# Patient Record
Sex: Female | Born: 1985
Health system: Southern US, Community
[De-identification: ages and names within clinical notes are randomized; demographics above are authoritative.]

## PROBLEM LIST (undated history)

## (undated) DIAGNOSIS — R87619 Unspecified abnormal cytological findings in specimens from cervix uteri: Secondary | ICD-10-CM

## (undated) DIAGNOSIS — F419 Anxiety disorder, unspecified: Secondary | ICD-10-CM

## (undated) DIAGNOSIS — E079 Disorder of thyroid, unspecified: Secondary | ICD-10-CM

## (undated) HISTORY — DX: Unspecified abnormal cytological findings in specimens from cervix uteri: R87.619

## (undated) HISTORY — PX: COLPOSCOPY: SHX161

## (undated) HISTORY — DX: Anxiety disorder, unspecified: F41.9

## (undated) HISTORY — DX: Disorder of thyroid, unspecified: E07.9

## (undated) HISTORY — PX: CERVICAL BIOPSY  W/ LOOP ELECTRODE EXCISION: SUR135

---

## 2015-12-09 ENCOUNTER — Encounter: Payer: Self-pay | Admitting: Obstetrics and Gynecology

## 2015-12-09 ENCOUNTER — Ambulatory Visit (INDEPENDENT_AMBULATORY_CARE_PROVIDER_SITE_OTHER): Payer: BLUE CROSS/BLUE SHIELD | Admitting: Obstetrics and Gynecology

## 2015-12-09 VITALS — BP 148/90 | HR 84 | Resp 14 | Ht 67.5 in | Wt 163.0 lb

## 2015-12-09 DIAGNOSIS — N760 Acute vaginitis: Secondary | ICD-10-CM

## 2015-12-09 DIAGNOSIS — B9689 Other specified bacterial agents as the cause of diseases classified elsewhere: Secondary | ICD-10-CM

## 2015-12-09 DIAGNOSIS — R030 Elevated blood-pressure reading, without diagnosis of hypertension: Secondary | ICD-10-CM | POA: Diagnosis not present

## 2015-12-09 DIAGNOSIS — A499 Bacterial infection, unspecified: Secondary | ICD-10-CM

## 2015-12-09 DIAGNOSIS — D069 Carcinoma in situ of cervix, unspecified: Secondary | ICD-10-CM | POA: Diagnosis not present

## 2015-12-09 MED ORDER — METRONIDAZOLE 500 MG PO TABS: 500.0000 mg | ORAL_TABLET | Freq: Two times a day (BID) | ORAL | Status: DC

## 2015-12-09 NOTE — Patient Instructions (Signed)
Loop Electrosurgical Excision Procedure  Loop electrosurgical excision procedure (LEEP) is the removal of a portion of the lower part of the uterus (cervix). The procedure is done when there are significantly abnormal cervical cell changes. Abnormal cell changes of the cervix can lead to cancer if left in place and untreated.     The LEEP procedure itself typically only takes a few minutes. Often, it may be done in your caregiver's office. The procedure is considered safe for those who wish to get pregnant or are trying to get pregnant. Only under rare circumstances should this procedure be done if you are pregnant.  LET YOUR CAREGIVER KNOW ABOUT:  · Whether you are pregnant or late for your last menstrual period.  · Allergies to foods or medicines.  · All the medicines you are taking including herbs, eyedrops, and over-the-counter medicines, and creams.  · Use of steroids (by mouth or creams).  · Previous problems with anesthetics or numbing medicine.  · Previous gynecological surgery.  · History of blood clots or bleeding problems.  · Any recent or current vaginal infections (herpes, sexually transmitted infections).  · Other health problems.  RISKS AND COMPLICATIONS  · Bleeding.  · Infection.  · Injury to the vagina, bladder, or rectum.  · Very rare obstruction of the cervical opening that causes problems during menstruation (cervical stenosis).  BEFORE THE PROCEDURE  · Do not take aspirin or blood thinners (anticoagulants) for 1 week before the procedure, or as told by your caregiver.  · Eat a light meal before the procedure.  · Ask your caregiver about changing or stopping your regular medicines.  · You may be given a pain reliever 1 or 2 hours before the procedure.  PROCEDURE   · A tool (speculum) is placed in the vagina. This allows your caregiver to see the cervix.  · An iodine stain is applied to the cervix to find the area of abnormal cells to be removed.  · Medicine is injected to numb the cervix (local  anesthetic).    · Electricity is passed through a thin wire loop which is then used to remove (cauterize) a small segment of the affected cervix.  · Light electrocautery is used to seal any small blood vessels and prevent bleeding.  · A paste may be applied to the cauterized area of the cervix to help prevent bleeding.  · The tissue sample is sent to the lab. It is examined under the microscope.  AFTER THE PROCEDURE  · Have someone drive you home.  · You may have slight to moderate cramping.  · You may notice a black vaginal discharge from the paste used on the cervix to prevent bleeding. This is normal.  · Watch for excessive bleeding. This requires immediate medical care.  · Ask when your test results will be ready. Make sure you get your test results.     This information is not intended to replace advice given to you by your health care provider. Make sure you discuss any questions you have with your health care provider.     Document Released: 12/12/2002 Document Revised: 12/14/2011 Document Reviewed: 03/03/2011  Elsevier Interactive Patient Education ©2016 [employer] Inc[employer]

## 2015-12-09 NOTE — Progress Notes (Signed)
Patient ID: Ma HillockMarissa R Bush, female   DOB: May 31, 1986, 30 y.o.   MRN: [id number] 30 y.o. G1P0010 SingleCaucasianF here to follow up abnormal PAP/colposcopy that was done in [place].  Records reviewed. Her pap from 10/15/15 returned with ASC-H, cannot r/o HGSIL, +BV. Colposcopy was done on 11/13/15, it was satisfactory, 2 cervical biopsies returned with CIN I, ECC with CIN 2-3. She denies increased vaginal d/c or odor.  Prior to this visit, her BP has been normal at her GYN visits for the last few years.  She hasn't been sexually active since January. Plans to abstain until she is finished with treated for the cervical dysplasia.  Period Cycle (Days): 28 Period Duration (Days): 4 days  Period Pattern: Regular Menstrual Flow: Moderate Menstrual Control: Maxi pad, Tampon Dysmenorrhea: (!) Moderate Dysmenorrhea Symptoms: Cramping  Patient's last menstrual period was 11/17/2015.          Sexually active: Yes.    The current method of family planning is none.    Exercising: Yes.    5 days a week Smoker:  no  Health Maintenance: Pap:  10/15/15 HSIL - had colposcopy CIN2-3 HSIL History of abnormal Pap:  yes MMG:  Had MRI of breast 11/2014- WNL  Colonoscopy:  Never BMD:   Never TDaP:  unsure Gardasil: no    reports that she has never smoked. She has never used smokeless tobacco. She reports that she drinks alcohol. She reports that she does not use illicit drugs. The patient recently moved here from [place]. She is a Charity fundraiserChemist.   She lost 120 lbs in the last 2 years.   Past Medical History  Diagnosis Date  . Abnormal Pap smear of cervix     Past Surgical History  Procedure Laterality Date  . Colposcopy      Current Outpatient Prescriptions  Medication Sig Dispense Refill  . metroNIDAZOLE (FLAGYL) 500 MG tablet Take 1 tablet (500 mg total) by mouth 2 (two) times daily. 14 tablet 0  . Prenatal Multivit-Min-Fe-FA (PRE-NATAL PO) Take by mouth.    . valACYclovir (VALTREX) 1000 MG tablet   7    No current facility-administered medications for this visit.    Family History  Problem Relation Age of Onset  . Breast cancer Mother 2237    age 30 and 4554  . Lung cancer Maternal Grandmother     Review of Systems  Constitutional: Negative.   HENT: Negative.   Eyes: Negative.   Respiratory: Negative.   Cardiovascular: Negative.   Gastrointestinal: Negative.   Endocrine: Negative.   Genitourinary: Negative.   Musculoskeletal: Negative.   Skin: Negative.   Allergic/Immunologic: Negative.   Neurological: Negative.   Psychiatric/Behavioral: Negative.     Exam:   BP 148/90 mmHg  Pulse 84  Resp 14  Ht 5' 7.5" (1.715 m)  Wt 163 lb (73.936 kg)  BMI 25.14 kg/m2  LMP 11/17/2015  Weight change: @WEIGHTCHANGE @ Height:   Height: 5' 7.5" (171.5 cm)  Ht Readings from Last 3 Encounters:  12/09/15 5' 7.5" (1.715 m)    General appearance: alert, cooperative and appears stated age  A:  ASC-H pap, colpo with CIN 1 and ECC + for CIN II-III  BV on pap  Elevated BP, no h/o elevated BP (records reviewed), she is very anxious  P:   Reviewed her abnormal pap and colposcopy results. Discussed hpv infection.   Recommend she have a loop cone cervical biopsy  Discussed the risks, including: bleeding, infection, cervical incompetence or pre-term dilation/labor. Patient aware she  can't be sexually active for 1 month s/p loop cone  Questions answered  Will treat BV to help decrease the risk of infection.  CC: Dr Leland Johns    20 minutes was spent face to face with the patient, >50% in counseling

## 2015-12-11 ENCOUNTER — Other Ambulatory Visit: Payer: Self-pay

## 2015-12-16 ENCOUNTER — Telehealth: Payer: Self-pay

## 2015-12-16 ENCOUNTER — Ambulatory Visit: Payer: BLUE CROSS/BLUE SHIELD | Admitting: Obstetrics and Gynecology

## 2015-12-16 NOTE — Telephone Encounter (Signed)
Spoke with patient at time of incoming call. Patient is scheduled for a LEEP today at 12:45 pm with Dr.Jertson. Reports she started her cycle yesterday 12/15/2015 and is having moderate bleeding. States that her cycle usually lasts 4 days. Appointment for LEEP rescheduled to 12/19/2015 at 9 am with Dr.Jertson. She is agreeable to date and time.  Routing to provider for final review. Patient agreeable to disposition. Will close encounter.

## 2015-12-19 ENCOUNTER — Ambulatory Visit (INDEPENDENT_AMBULATORY_CARE_PROVIDER_SITE_OTHER): Payer: BLUE CROSS/BLUE SHIELD | Admitting: Obstetrics and Gynecology

## 2015-12-19 ENCOUNTER — Encounter: Payer: Self-pay | Admitting: Obstetrics and Gynecology

## 2015-12-19 DIAGNOSIS — D069 Carcinoma in situ of cervix, unspecified: Secondary | ICD-10-CM

## 2015-12-19 NOTE — Patient Instructions (Signed)
LEEP Post-procedure Instructions . Cramping is common.  You may take Ibuprofen, Aleve, or Tylenol for the cramping.  This should resolve within the next two to three days.   . You may have bright red spotting or blackish discharge for several days after your procedure.  The discharge occurs because of a topical solution used to stop bleeding at the biopsy site(s).  The dark discharge will lighten and then turn clear before completely resolving.  You will need to wear a mini pad during this time. . . Refrain from putting anything in the vagina until the bleeding and/or discharge COMPLETLEY stops (usually two to three weeks). . You need to call the office if you have any pelvic pain, fever, heavy bleeding, foul smelling vaginal discharge, or if you are concerned. . Shower or bathe as normal . You will be notified within one week of your biopsy results or we will discuss your results at your follow-up appointment if needed. . You will need to return for surveillance Pap smear(s) as advised by your physician. Marland Kitchen. Avoid intercourse x 4 weeks

## 2015-12-19 NOTE — Progress Notes (Signed)
Patient ID: Jasmine Bush, female   DOB: [birth year], 30 y.o.   MRN: [id number] GYNECOLOGY  VISIT   HPI: 30 y.o.   Single  Caucasian  female   G1P0010 with Patient's last menstrual period was 12/15/2015.   here for a LOOP cone for HSIL on ECC  GYNECOLOGIC HISTORY: Patient's last menstrual period was 12/15/2015. Contraception:none Menopausal hormone therapy: none         OB History    Gravida Para Term Preterm AB TAB SAB Ectopic Multiple Living   1    1              There are no active problems to display for this patient.   Past Medical History  Diagnosis Date  . Abnormal Pap smear of cervix     Past Surgical History  Procedure Laterality Date  . Colposcopy      Current Outpatient Prescriptions  Medication Sig Dispense Refill  . Prenatal Multivit-Min-Fe-FA (PRE-NATAL PO) Take by mouth.    . valACYclovir (VALTREX) 1000 MG tablet   7   No current facility-administered medications for this visit.     ALLERGIES: Cephalosporins and Penicillins  Family History  Problem Relation Age of Onset  . Breast cancer Mother 3337    age 30 and 7254  . Lung cancer Maternal Grandmother     Social History   Social History  . Marital Status: Single    Spouse Name: N/A  . Number of Children: N/A  . Years of Education: N/A   Occupational History  . Not on file.   Social History Main Topics  . Smoking status: Never Smoker   . Smokeless tobacco: Never Used  . Alcohol Use: 0.0 oz/week    0 Standard drinks or equivalent per week     Comment: 1-2 per month  . Drug Use: No  . Sexual Activity:    Partners: Male   Other Topics Concern  . Not on file   Social History Narrative    Review of Systems  Constitutional: Negative.   HENT: Negative.   Eyes: Negative.   Respiratory: Negative.   Cardiovascular: Negative.   Gastrointestinal: Negative.   Genitourinary: Negative.   Musculoskeletal: Negative.   Skin: Negative.   Neurological: Negative.   Endo/Heme/Allergies:  Negative.   Psychiatric/Behavioral: Negative.     PHYSICAL EXAMINATION:    BP 142/80 mmHg  Pulse 108  Resp 16  Wt 165 lb (74.844 kg)  LMP 12/15/2015    General appearance: alert, cooperative and appears stated age  Negative UPT  Procedure: The patient was counseled as to the risks of the procedure, including: infection, bleeding, future pregnancy risks and cervical stenosis. A consent form was signed.  Colposcopy was unsatisfactory. Lugols solution was placed on the cervix and a paracervical block was injected using 1% lidocaine with epinephrine. The 1.2 x 1 cm loop was used to remove a portion of the exocervix taking care to get the entire transformation zone.  A second 1 x 1 cm loop was used to remove a portion of the endocervix. The settings were 55 cut, 50 coag with a blend of 1.  An ECC was performed. The cautery ball was then used to cauterize the base of the biopsy site and monsels were placed. The patient tolerated the procedure well.    Chaperone was present for exam.  ASSESSMENT HSIL on ECC    PLAN LOOP cone cervical biopsy with ECC done F/U in    An After Visit Summary was  printed and given to the patient.

## 2015-12-27 ENCOUNTER — Telehealth: Payer: Self-pay | Admitting: Emergency Medicine

## 2015-12-27 NOTE — Telephone Encounter (Signed)
Patient notified of results and verbalized understanding of results. She is agreeable to follow up PAP with HPV in 6 months and this is scheduled for 06/25/16. 06 Recall entered.

## 2015-12-27 NOTE — Telephone Encounter (Signed)
-----   Message from Romualdo BolkJill Evelyn Jertson, MD sent at 12/24/2015  5:55 PM EDT ----- Please inform the patient that her loop cone returned with CIN 3, with negative deep endocervical margins, her ECC was negative but scant sample. Typically I would repeat her pap and hpv in 1 year, but since both the margins of the endocervical leep were negative for dysplasia, but still + for CIN III (usually continuous lesion and + in the exocervical leep specimen) and her scant ECC, I would like to repeat her pap with hpv in 6 months. Please inform and set up

## 2016-01-22 ENCOUNTER — Ambulatory Visit (INDEPENDENT_AMBULATORY_CARE_PROVIDER_SITE_OTHER): Payer: BLUE CROSS/BLUE SHIELD | Admitting: Obstetrics and Gynecology

## 2016-01-22 ENCOUNTER — Encounter: Payer: Self-pay | Admitting: Obstetrics and Gynecology

## 2016-01-22 VITALS — BP 130/80 | HR 80 | Resp 15 | Wt 170.0 lb

## 2016-01-22 DIAGNOSIS — Z9889 Other specified postprocedural states: Secondary | ICD-10-CM

## 2016-01-22 NOTE — Progress Notes (Signed)
Patient ID: Jasmine Bush, female   DOB: 11-07-1985, 30 y.o.   MRN: [id number] GYNECOLOGY  VISIT   HPI: 30 y.o.   Single  Caucasian  female   G1P0010 with Patient's last menstrual period was 12/15/2015.   here for 1 month follow up from LEEP procedure. She had a loop cone for CIN III, the endocervical portion was +CIN III,  Both margins were negative and her ECC was scant. Exocervix with CIN III with +endocervical and negative exocervical margins. She spotted for a couple of weeks after. She is a little late for her cycle. Normally very regular. No pain. She is not sexually active, hasn't been for a long time.   GYNECOLOGIC HISTORY: Patient's last menstrual period was 12/15/2015. Contraception:None ( Not currently sexually active)  Menopausal hormone therapy: None        OB History    Gravida Para Term Preterm AB TAB SAB Ectopic Multiple Living   1    1              There are no active problems to display for this patient.   Past Medical History  Diagnosis Date  . Abnormal Pap smear of cervix     Past Surgical History  Procedure Laterality Date  . Colposcopy      Current Outpatient Prescriptions  Medication Sig Dispense Refill  . Prenatal Multivit-Min-Fe-FA (PRE-NATAL PO) Take by mouth.    . valACYclovir (VALTREX) 1000 MG tablet   7   No current facility-administered medications for this visit.     ALLERGIES: Cephalosporins and Penicillins  Family History  Problem Relation Age of Onset  . Breast cancer Mother 4237    age 30 and 4654  . Lung cancer Maternal Grandmother     Social History   Social History  . Marital Status: Single    Spouse Name: N/A  . Number of Children: N/A  . Years of Education: N/A   Occupational History  . Not on file.   Social History Main Topics  . Smoking status: Never Smoker   . Smokeless tobacco: Never Used  . Alcohol Use: 0.0 oz/week    0 Standard drinks or equivalent per week     Comment: 1-2 per month  . Drug Use: No  .  Sexual Activity:    Partners: Male   Other Topics Concern  . Not on file   Social History Narrative    Review of Systems  Constitutional: Negative.   HENT: Negative.   Eyes: Negative.   Respiratory: Negative.   Cardiovascular: Negative.   Gastrointestinal: Negative.   Genitourinary: Negative.   Musculoskeletal: Negative.   Skin: Negative.   Neurological: Negative.   Endo/Heme/Allergies: Negative.   Psychiatric/Behavioral: Negative.     PHYSICAL EXAMINATION:    BP 130/80 mmHg  Pulse 80  Resp 15  Wt 170 lb (77.111 kg)  LMP 12/15/2015    General appearance: alert, cooperative and appears stated age  Pelvic: External genitalia:  no lesions              Urethra:  normal appearing urethra with no masses, tenderness or lesions              Bartholins and Skenes: normal                 Vagina: normal appearing vagina with normal color and discharge, no lesions              Cervix: no lesions and well healed, able  to pass a cytobrush into her cervix              Bimanual Exam:  Uterus:  normal size, contour, position, consistency, mobility, non-tender and anteverted              Adnexa: no mass, fullness, tenderness              Chaperone was present for exam.  ASSESSMENT S/P LEEP healing well Cycle is late, not sexually active.    PLAN F/U pap in 9/17 She will call in 2 weeks if she doesn't start a cycle   An After Visit Summary was printed and given to the patient.

## 2016-02-20 ENCOUNTER — Ambulatory Visit: Payer: BLUE CROSS/BLUE SHIELD | Admitting: Obstetrics and Gynecology

## 2016-06-25 ENCOUNTER — Ambulatory Visit (INDEPENDENT_AMBULATORY_CARE_PROVIDER_SITE_OTHER): Payer: BLUE CROSS/BLUE SHIELD | Admitting: Obstetrics and Gynecology

## 2016-06-25 ENCOUNTER — Encounter: Payer: Self-pay | Admitting: Obstetrics and Gynecology

## 2016-06-25 VITALS — BP 120/80 | HR 92 | Resp 20 | Ht 67.5 in | Wt 153.4 lb

## 2016-06-25 DIAGNOSIS — Z3169 Encounter for other general counseling and advice on procreation: Secondary | ICD-10-CM | POA: Diagnosis not present

## 2016-06-25 DIAGNOSIS — Z124 Encounter for screening for malignant neoplasm of cervix: Secondary | ICD-10-CM

## 2016-06-25 DIAGNOSIS — Z87410 Personal history of cervical dysplasia: Secondary | ICD-10-CM | POA: Diagnosis not present

## 2016-06-25 DIAGNOSIS — Z803 Family history of malignant neoplasm of breast: Secondary | ICD-10-CM | POA: Diagnosis not present

## 2016-06-25 DIAGNOSIS — N946 Dysmenorrhea, unspecified: Secondary | ICD-10-CM

## 2016-06-25 MED ORDER — NAPROXEN SODIUM 550 MG PO TABS: 550.0000 mg | ORAL_TABLET | Freq: Two times a day (BID) | ORAL | 2 refills | Status: DC

## 2016-06-25 NOTE — Progress Notes (Signed)
GYNECOLOGY  VISIT   HPI: 30 y.o.   Single  Caucasian  female   G1P0010 with Patient's last menstrual period was 06/18/2016.   here for 6 month pap smear. Loop cone for CIN III. Endocervical portion was + CIN III. Both margins neg, ECC was scant. Exocervix with CIN III with +Endocervical and neg exocervical margins.  She has been unprotected intercourse for over a year without pregnancy.  Having sex every other day during the fertile time. Menses q 28-29 days x 4-5 days. Saturates a pad in 2 hours. No BTB. Cramps are bad, not really helped with tylenol.  No h/o STD's Fiance, Jarred Fitch, 25, healthy. No drugs or smoking.  Mom had breast cancer at 63, BRCA negative. Was told to start getting mammograms this year. She had a breast MRI last year that was normal.  Getting married in 11/17 on the [place].   GYNECOLOGIC HISTORY: Patient's last menstrual period was 06/18/2016. Contraception:None Menopausal hormone therapy: None        OB History    Gravida Para Term Preterm AB Living   1       1     SAB TAB Ectopic Multiple Live Births                     There are no active problems to display for this patient.   Past Medical History:  Diagnosis Date  . Abnormal Pap smear of cervix     Past Surgical History:  Procedure Laterality Date  . COLPOSCOPY      Current Outpatient Prescriptions  Medication Sig Dispense Refill  . Prenatal Multivit-Min-Fe-FA (PRE-NATAL PO) Take by mouth.    . sertraline (ZOLOFT) 100 MG tablet Take 200 mg by mouth daily.  2  . valACYclovir (VALTREX) 1000 MG tablet   7   No current facility-administered medications for this visit.      ALLERGIES: Cephalosporins and Penicillins  Family History  Problem Relation Age of Onset  . Breast cancer Mother 68    age 38 and 46  . Lung cancer Maternal Grandmother     Social History   Social History  . Marital status: Single    Spouse name: N/A  . Number of children: N/A  . Years of education: N/A    Occupational History  . Not on file.   Social History Main Topics  . Smoking status: Never Smoker  . Smokeless tobacco: Never Used  . Alcohol use 0.0 oz/week     Comment: 1-2 per month  . Drug use: No  . Sexual activity: Yes    Partners: Male   Other Topics Concern  . Not on file   Social History Narrative  . No narrative on file    Review of Systems  Constitutional: Negative.   HENT: Negative.   Eyes: Negative.   Respiratory: Negative.   Cardiovascular: Negative.   Gastrointestinal: Negative.   Genitourinary: Negative.   Musculoskeletal: Negative.   Skin: Negative.   Neurological: Negative.   Endo/Heme/Allergies: Negative.   Psychiatric/Behavioral: Negative.     PHYSICAL EXAMINATION:    BP 120/80 (BP Location: Right Arm, Patient Position: Sitting, Cuff Size: Normal)   Pulse 92   Resp 20   Ht 5' 7.5" (1.715 m)   Wt 153 lb 6.4 oz (69.6 kg)   LMP 06/18/2016   BMI 23.67 kg/m     General appearance: alert, cooperative and appears stated age  Pelvic: External genitalia:  no lesions  Urethra:  normal appearing urethra with no masses, tenderness or lesions              Bartholins and Skenes: normal                 Vagina: normal appearing vagina with normal color and discharge, no lesions              Cervix:no lesions  Chaperone was present for exam.  ASSESSMENT CIN III, s/p leep, scant ECC, endocervical portion with CIN III with negative margins on both sides Infertility, normal cycles Dysmenorrhea Family history of breast cancer in her mother at 66 and again in her 61's, BRCA negative, doing well      PLAN Pap with hpv Plan annual exam in 6 months Start BBT charts Semen analysis Can try Ovulation Predictor kits F/U in 2 months to review Will set her up for a screening mammogram Anaprox for cramps   An After Visit Summary was printed and given to the patient.

## 2016-06-29 ENCOUNTER — Telehealth: Payer: Self-pay | Admitting: Obstetrics and Gynecology

## 2016-06-29 NOTE — Telephone Encounter (Signed)
Jasmine BurgerCarrie Bush from [facility] calling to find out if this referral is for [name] for semen analysis or is it for both of them. She states she needs the address and phone to schedule an appointment. She can be reached at [phone].

## 2016-07-03 LAB — IPS PAP TEST WITH HPV

## 2016-07-06 ENCOUNTER — Telehealth: Payer: Self-pay | Admitting: *Deleted

## 2016-07-06 MED ORDER — METRONIDAZOLE 500 MG PO TABS: 500.0000 mg | ORAL_TABLET | Freq: Two times a day (BID) | ORAL | 0 refills | Status: DC

## 2016-07-06 NOTE — Telephone Encounter (Signed)
-----   Message from Romualdo BolkJill Evelyn Jertson, MD sent at 07/03/2016  6:28 PM EDT ----- Please call the patient and let her know her pap is negative and her hpv is negative!!! She had a loop cone 6 months ago. Pap was + for BV, if symptomatic, please treat with flagyl 500 mg po BID x 7 days (no ETOH) F/U pap in 6 months

## 2016-07-06 NOTE — Telephone Encounter (Signed)
Spoke with patient and gave results of PAP. Patient is having some discharge so RX for Flagyl was sent int. Patient was advised to avoid alcohol while taking the Flagyl. Patient voiced understanding. Patient placed in 06 recall and appointment made for 6 month repeat PAP scheduled -eh

## 2016-07-07 ENCOUNTER — Telehealth: Payer: Self-pay

## 2016-07-07 DIAGNOSIS — Z1231 Encounter for screening mammogram for malignant neoplasm of breast: Secondary | ICD-10-CM

## 2016-07-07 DIAGNOSIS — Z803 Family history of malignant neoplasm of breast: Secondary | ICD-10-CM

## 2016-07-07 NOTE — Telephone Encounter (Signed)
Jasmine BolkJill Evelyn Jertson, MD  Jasmine AskewKaitlyn E Hines, RN        Please schedule this patient for a screening mammogram at the breast center. She is only 30, this would be her first imaging there (had MRI elsewhere last year). She was told to start mammograms this year. Mother with breast cancer at 437.  Thanks,  Jasmine BillsJill    Spoke with the [facility]. Appointment for screening mammogram scheduled for 07/10/2016 at 4:30 pm. Spoke with patient. Patient is agreeable to date and time.  Routing to provider for final review. Patient agreeable to disposition. Will close encounter.

## 2016-07-10 ENCOUNTER — Ambulatory Visit
Admission: RE | Admit: 2016-07-10 | Discharge: 2016-07-10 | Disposition: A | Discharge status: Home / Self Care | Payer: BLUE CROSS/BLUE SHIELD | Source: Ambulatory Visit | Attending: Obstetrics and Gynecology | Admitting: Obstetrics and Gynecology

## 2016-07-10 DIAGNOSIS — Z803 Family history of malignant neoplasm of breast: Secondary | ICD-10-CM

## 2016-07-10 DIAGNOSIS — Z1231 Encounter for screening mammogram for malignant neoplasm of breast: Secondary | ICD-10-CM

## 2016-08-13 ENCOUNTER — Other Ambulatory Visit: Payer: Self-pay

## 2016-08-13 DIAGNOSIS — Z803 Family history of malignant neoplasm of breast: Secondary | ICD-10-CM

## 2016-08-20 ENCOUNTER — Telehealth: Payer: Self-pay | Admitting: Obstetrics and Gynecology

## 2016-08-20 NOTE — Telephone Encounter (Signed)
Please let the patient know that her partners semen analysis is normal other than a slightly low normal morphology. Dr April MansonYalcinkaya recommends he take Vit C 1,000 mg a day, vit E 400 units a day, folic acid 400 mcg a day and zinc 50 mg a day. Please set them up for a consultation with Dr April MansonYalcinkaya.

## 2016-08-21 NOTE — Telephone Encounter (Signed)
Spoke with patient. Advised of results and message as seen below from Dr.Jertson. Patient is agreeable and will notify her partner. She will contact Dr.Yalcinkaya's office to schedule a consultation appointment at her earliest convenience.  Routing to provider for final review. Patient agreeable to disposition. Will close encounter.

## 2016-08-24 ENCOUNTER — Encounter: Payer: Self-pay | Admitting: Obstetrics and Gynecology

## 2016-08-24 ENCOUNTER — Telehealth: Payer: Self-pay | Admitting: Genetic Counselor

## 2016-08-24 ENCOUNTER — Ambulatory Visit (INDEPENDENT_AMBULATORY_CARE_PROVIDER_SITE_OTHER): Payer: BLUE CROSS/BLUE SHIELD | Admitting: Obstetrics and Gynecology

## 2016-08-24 ENCOUNTER — Encounter: Payer: Self-pay | Admitting: Genetic Counselor

## 2016-08-24 VITALS — BP 118/70 | HR 84 | Resp 14 | Wt 161.0 lb

## 2016-08-24 DIAGNOSIS — Z3169 Encounter for other general counseling and advice on procreation: Secondary | ICD-10-CM | POA: Diagnosis not present

## 2016-08-24 DIAGNOSIS — Z803 Family history of malignant neoplasm of breast: Secondary | ICD-10-CM | POA: Diagnosis not present

## 2016-08-24 DIAGNOSIS — N946 Dysmenorrhea, unspecified: Secondary | ICD-10-CM | POA: Diagnosis not present

## 2016-08-24 NOTE — Telephone Encounter (Signed)
Pt confirmed appt, verified demo and insurance, mailed pt letter, faxed referring provider appt date/time. °

## 2016-08-24 NOTE — Progress Notes (Signed)
GYNECOLOGY  VISIT   HPI: 30 y.o.   Single caucasian  female   G1P0010 with Patient's last menstrual period was 08/16/2016.   here for follow up basal body temp charting. Pt also c/o dysmenorrhea.      IUD was taken out in May in 2016. Cycles are every 30 days x 4-6 days. Saturates a pad in up to 2 hours. No bleeding in between her cycles. Since having her IUD out her cramps have slowly gotten worse. She has Naproxen that helps a little. Heating pad helps. Goes to work, but doesn't function well.  + ovulation on BBT charts. She hasn't tried ovulation predictor kits yet, but plans to this month. She c/o intermittent cramping after intercourse, occurring about 2/10 times, mild. No dyspareunia.  Partners SA with slightly low normal morphology.  She got married 08/15/16, had a great time.   GYNECOLOGIC HISTORY: Patient's last menstrual period was 08/16/2016. Contraception:none  Menopausal hormone therapy: none         OB History    Gravida Para Term Preterm AB Living   1       1     SAB TAB Ectopic Multiple Live Births                     Patient Active Problem List   Diagnosis Date Noted  . History of cervical dysplasia 06/25/2016  . Family history of breast cancer 06/25/2016    Past Medical History:  Diagnosis Date  . Abnormal Pap smear of cervix     Past Surgical History:  Procedure Laterality Date  . COLPOSCOPY      Current Outpatient Prescriptions  Medication Sig Dispense Refill  . naproxen sodium (ANAPROX DS) 550 MG tablet Take 1 tablet (550 mg total) by mouth 2 (two) times daily with a meal. 30 tablet 2  . Prenatal Multivit-Min-Fe-FA (PRE-NATAL PO) Take by mouth.    . sertraline (ZOLOFT) 100 MG tablet Take 200 mg by mouth daily.  2  . valACYclovir (VALTREX) 1000 MG tablet   7   No current facility-administered medications for this visit.      ALLERGIES: Cephalosporins and Penicillins  Family History  Problem Relation Age of Onset  . Breast cancer Mother [id number]     age 30 and 2654  . Lung cancer Maternal Grandmother   . Endometriosis Sister     Social History   Social History  . Marital status: Single    Spouse name: N/A  . Number of children: N/A  . Years of education: N/A   Occupational History  . Not on file.   Social History Main Topics  . Smoking status: Never Smoker  . Smokeless tobacco: Never Used  . Alcohol use 0.0 oz/week     Comment: 1-2 per month  . Drug use: No  . Sexual activity: Yes    Partners: Male   Other Topics Concern  . Not on file   Social History Narrative  . No narrative on file    Review of Systems  Constitutional: Negative.   HENT: Negative.   Eyes: Negative.   Respiratory: Negative.   Cardiovascular: Negative.   Gastrointestinal: Negative.   Genitourinary:       Dysmenorrhea   Musculoskeletal: Negative.   Skin: Negative.   Neurological: Negative.   Endo/Heme/Allergies: Negative.   Psychiatric/Behavioral: Negative.     PHYSICAL EXAMINATION:    BP 118/70 (BP Location: Right Arm, Patient Position: Sitting, Cuff Size: Normal)   Pulse 84  Resp 14   Wt 161 lb (73 kg)   LMP 08/16/2016   BMI 24.84 kg/m     General appearance: alert, cooperative and appears stated age  ASSESSMENT Infertility, SA with low morphology Dysmenorrhea FH of breast cancer    PLAN Recommended she f/u with Dr April MansonYalcinkaya Continue PNV Continue BBT and do OPkits this month Consult with a genetic counselor    An After Visit Summary was printed and given to the patient.  10 minutes face to face time of which over 50% was spent in counseling.   CC: Dr April MansonYalcinkaya

## 2016-10-03 ENCOUNTER — Other Ambulatory Visit: Payer: Self-pay | Admitting: Obstetrics and Gynecology

## 2016-10-06 NOTE — Telephone Encounter (Signed)
Medication refill request: Valacyclovir Last AEX:  08/24/16 JJ (Last visit) Next AEX: 01/06/17 JJ Last MMG (if hormonal medication request): 07/15/16 Blake [email or web address], [facility] Refill authorized: Historical Entry. 12/08/15

## 2016-10-08 ENCOUNTER — Telehealth: Payer: Self-pay | Admitting: Genetic Counselor

## 2016-10-08 NOTE — Telephone Encounter (Signed)
Appointments canceled per patient request. Patient has moved away. She stated that she does not wish to reschedule.

## 2016-10-12 ENCOUNTER — Encounter: Payer: BLUE CROSS/BLUE SHIELD | Admitting: Genetic Counselor

## 2016-10-12 ENCOUNTER — Other Ambulatory Visit: Payer: BLUE CROSS/BLUE SHIELD

## 2017-01-03 HISTORY — PX: INTRAUTERINE DEVICE (IUD) INSERTION: SHX5877

## 2017-01-06 ENCOUNTER — Encounter: Payer: Self-pay | Admitting: Obstetrics and Gynecology

## 2017-01-06 ENCOUNTER — Ambulatory Visit
Admission: RE | Admit: 2017-01-06 | Discharge: 2017-01-06 | Disposition: A | Discharge status: Home / Self Care | Payer: BLUE CROSS/BLUE SHIELD | Source: Ambulatory Visit | Attending: Obstetrics and Gynecology | Admitting: Obstetrics and Gynecology

## 2017-01-06 ENCOUNTER — Ambulatory Visit (INDEPENDENT_AMBULATORY_CARE_PROVIDER_SITE_OTHER): Payer: BLUE CROSS/BLUE SHIELD | Admitting: Obstetrics and Gynecology

## 2017-01-06 VITALS — BP 110/78 | HR 100 | Resp 16 | Ht 67.5 in | Wt 163.0 lb

## 2017-01-06 DIAGNOSIS — Z309 Encounter for contraceptive management, unspecified: Secondary | ICD-10-CM | POA: Diagnosis not present

## 2017-01-06 DIAGNOSIS — Z01419 Encounter for gynecological examination (general) (routine) without abnormal findings: Secondary | ICD-10-CM | POA: Diagnosis not present

## 2017-01-06 DIAGNOSIS — Z124 Encounter for screening for malignant neoplasm of cervix: Secondary | ICD-10-CM | POA: Diagnosis not present

## 2017-01-06 DIAGNOSIS — Z9889 Other specified postprocedural states: Secondary | ICD-10-CM | POA: Diagnosis not present

## 2017-01-06 DIAGNOSIS — Z23 Encounter for immunization: Secondary | ICD-10-CM | POA: Diagnosis not present

## 2017-01-06 DIAGNOSIS — E538 Deficiency of other specified B group vitamins: Secondary | ICD-10-CM | POA: Diagnosis not present

## 2017-01-06 DIAGNOSIS — E010 Iodine-deficiency related diffuse (endemic) goiter: Secondary | ICD-10-CM | POA: Diagnosis not present

## 2017-01-06 DIAGNOSIS — Z0000 Encounter for general adult medical examination without abnormal findings: Secondary | ICD-10-CM

## 2017-01-06 DIAGNOSIS — Z789 Other specified health status: Secondary | ICD-10-CM | POA: Diagnosis not present

## 2017-01-06 LAB — CBC
HCT: 43.3 % (ref 35.0–45.0)
Hemoglobin: 14.8 g/dL (ref 11.7–15.5)
MCH: 32.2 pg (ref 27.0–33.0)
MCHC: 34.2 g/dL (ref 32.0–36.0)
MCV: 94.3 fL (ref 80.0–100.0)
MPV: 8.6 fL (ref 7.5–12.5)
PLATELETS: 278 10*3/uL (ref 140–400)
RBC: 4.59 MIL/uL (ref 3.80–5.10)
RDW: 13.6 % (ref 11.0–15.0)
WBC: 5 10*3/uL (ref 3.8–10.8)

## 2017-01-06 LAB — COMPREHENSIVE METABOLIC PANEL
ALT: 14 U/L (ref 6–29)
AST: 18 U/L (ref 10–30)
Albumin: 4.4 g/dL (ref 3.6–5.1)
Alkaline Phosphatase: 38 U/L (ref 33–115)
BUN: 9 mg/dL (ref 7–25)
CHLORIDE: 105 mmol/L (ref 98–110)
CO2: 20 mmol/L (ref 20–31)
CREATININE: 0.76 mg/dL (ref 0.50–1.10)
Calcium: 9.6 mg/dL (ref 8.6–10.2)
GLUCOSE: 109 mg/dL — AB (ref 65–99)
Potassium: 4.5 mmol/L (ref 3.5–5.3)
SODIUM: 140 mmol/L (ref 135–146)
TOTAL PROTEIN: 7 g/dL (ref 6.1–8.1)
Total Bilirubin: 0.4 mg/dL (ref 0.2–1.2)

## 2017-01-06 LAB — LIPID PANEL
CHOLESTEROL: 169 mg/dL (ref <200)
HDL: 54 mg/dL (ref >50)
LDL Cholesterol: 87 mg/dL (ref <100)
Total CHOL/HDL Ratio: 3.1 Ratio (ref <5.0)
Triglycerides: 140 mg/dL (ref <150)
VLDL: 28 mg/dL (ref <30)

## 2017-01-06 MED ORDER — VALACYCLOVIR HCL 1 G PO TABS: ORAL_TABLET | ORAL | 3 refills | Status: DC

## 2017-01-06 MED ORDER — NAPROXEN SODIUM 550 MG PO TABS: 550.0000 mg | ORAL_TABLET | Freq: Two times a day (BID) | ORAL | 2 refills | Status: AC

## 2017-01-06 MED ORDER — MISOPROSTOL 200 MCG PO TABS: ORAL_TABLET | ORAL | 0 refills | Status: DC

## 2017-01-06 NOTE — Progress Notes (Signed)
31 y.o. G1P0010 SingleCaucasianF here for annual exam.   H/O Loop cone for CIN III in 3/17. Endocervical portion was + CIN III. Both margins neg, ECC was scant. Exocervix with CIN III with +Endocervical and neg exocervical margins. Pap from 6 months ago was negative with negative hpv. She has a h/o infertility. Ovulating on BBT charts. Partners SA with slightly low morphology. She has decided not to pursue infertility w/u at this time. Has decided to use birth control for the next few years and then reconsider fertility or adoption. She had an IUD in the past and did well (mirena). She now has severe cramps, developed and worsened since having her mirena IUD. Not currently using contraception.  Mom had breast cancer at 50, BRCA negative. Was told to start getting mammograms this year. She had a breast MRI last year that was normal. She had an appointment with a genetic counselor, but canceled it. She had the counseling in [place]. Was told her lifetime risk was over 40%. She was told to have MRI's yearly until she was 30, then to get yearly mammograms.  Got married in 11/17 on the [place].  She has anxiety, recently started on Zoloft, propanolol and lorazepam (in the last 6 months, better) Period Cycle (Days): 28 Period Duration (Days): 4-6  days  Period Pattern: Regular Menstrual Flow: Heavy Menstrual Control: Maxi pad Menstrual Control Change Freq (Hours): changes pad every 2 hours  Dysmenorrhea: (!) Severe Dysmenorrhea Symptoms: Cramping  Patient's last menstrual period was 12/10/2016.          Sexually active: Yes.    The current method of family planning is none.    Exercising: Yes.    yoga/ walking/ strength training/ pilates  Smoker:  no  Health Maintenance: Pap:  06-25-16 WNL NEG HR HPV 10-15-15 HGSIL History of abnormal Pap:  Yes - LEEP 12-19-15 loopcone returned with CIN 3 and ECC negative for dysplasia but +CIN III MMG:  Never Colonoscopy:  Never BMD:   Never TDaP:  Unsure   Gardasil: No    reports that she has never smoked. She has never used smokeless tobacco. She reports that she drinks alcohol. She reports that she does not use drugs. She is a English as a second language teacher.   Past Medical History:  Diagnosis Date  . Abnormal Pap smear of cervix   . Anxiety     Past Surgical History:  Procedure Laterality Date  . CERVICAL BIOPSY  W/ LOOP ELECTRODE EXCISION    . COLPOSCOPY      Current Outpatient Prescriptions  Medication Sig Dispense Refill  . LORazepam (ATIVAN) 1 MG tablet TK 1/2-1 T PO D  1  . naproxen sodium (ANAPROX DS) 550 MG tablet Take 1 tablet (550 mg total) by mouth 2 (two) times daily with a meal. 30 tablet 2  . Prenatal Multivit-Min-Fe-FA (PRE-NATAL PO) Take by mouth.    . propranolol (INDERAL) 10 MG tablet TK 1 T PO QD PRA  1  . sertraline (ZOLOFT) 100 MG tablet Take 200 mg by mouth daily.  2  . valACYclovir (VALTREX) 1000 MG tablet TAKE 1 TABLET(1000 MG) BY MOUTH EVERY DAY 30 tablet 2   No current facility-administered medications for this visit.     Family History  Problem Relation Age of Onset  . Breast cancer Mother 75    age 29 and 34  . Lung cancer Maternal Grandmother   . Endometriosis Sister     Review of Systems  Constitutional: Negative.   HENT: Negative.  Eyes: Negative.   Respiratory: Negative.   Cardiovascular: Negative.   Gastrointestinal: Negative.   Endocrine: Negative.   Genitourinary: Negative.   Musculoskeletal: Negative.   Skin: Negative.   Allergic/Immunologic: Negative.   Neurological: Negative.   Psychiatric/Behavioral: Negative.     Exam:   BP 110/78 (BP Location: Right Arm, Patient Position: Sitting, Cuff Size: Normal)   Pulse 100   Resp 16   Ht 5' 7.5" (1.715 m)   Wt 163 lb (73.9 kg)   LMP 12/10/2016   BMI 25.15 kg/m   Weight change: @WEIGHTCHANGE @ Height:   Height: 5' 7.5" (171.5 cm)  Ht Readings from Last 3 Encounters:  01/06/17 5' 7.5" (1.715 m)  06/25/16 5' 7.5" (1.715 m)  12/09/15 5' 7.5" (1.715  m)    General appearance: alert, cooperative and appears stated age Head: Normocephalic, without obvious abnormality, atraumatic Neck: no adenopathy, supple, symmetrical, trachea midline and thyroid right thyroid enlarged Lungs: clear to auscultation bilaterally Cardiovascular: regular rate and rhythm Breasts: normal appearance, no masses or tenderness Abdomen: soft, non-tender; bowel sounds normal; no masses,  no organomegaly Extremities: extremities normal, atraumatic, no cyanosis or edema Skin: Skin color, texture, turgor normal. No rashes or lesions Lymph nodes: Cervical, supraclavicular, and axillary nodes normal. No abnormal inguinal nodes palpated Neurologic: Grossly normal   Pelvic: External genitalia:  no lesions              Urethra:  normal appearing urethra with no masses, tenderness or lesions              Bartholins and Skenes: normal                 Vagina: normal appearing vagina with normal color and discharge, no lesions              Cervix: no lesions               Bimanual Exam:  Uterus:  normal size, contour, position, consistency, mobility, non-tender              Adnexa: no mass, fullness, tenderness               Rectovaginal: Confirms               Anus:  normal sphincter tone, no lesions  Chaperone was present for exam.  A:  Well Woman with normal exam  H/O CIN III, s/p LEEP 2017  Vegan diet  Contraception, return on her cycle for a Mirena IUD (cytotec called in)  Thyroid enlarged on the right, c/w a nodule  P:   Pap with hpv  Screening labs, including vit D and B12  TDAP  Continue with yearly mammograms  Get a copy of her genetics report  Discussed breast self exam  Discussed calcium and vit D intake  Discussed decreasing he valtrex to 500 mg a day, she wants to stay on 1,000 mg a day  Thyroid panel, thyroid ultrasound  Return on her cycle for IUD insertion

## 2017-01-06 NOTE — Progress Notes (Signed)
Scheduled patient while in office for thyroid ultrasound at [facility] [address] on 01/06/2017 at 2:15 pm with 1:55 pm arrival. Patient is agreeable to date and time. Patient placed in imaging hold.

## 2017-01-06 NOTE — Patient Instructions (Signed)

## 2017-01-07 DIAGNOSIS — E041 Nontoxic single thyroid nodule: Secondary | ICD-10-CM | POA: Insufficient documentation

## 2017-01-07 LAB — THYROID PANEL WITH TSH
FREE THYROXINE INDEX: 2 (ref 1.4–3.8)
T3 UPTAKE: 32 % (ref 22–35)
T4 TOTAL: 6.3 ug/dL (ref 4.5–12.0)
TSH: 0.04 m[IU]/L — AB

## 2017-01-07 LAB — VITAMIN D 25 HYDROXY (VIT D DEFICIENCY, FRACTURES): VIT D 25 HYDROXY: 29 ng/mL — AB (ref 30–100)

## 2017-01-07 LAB — VITAMIN B12: VITAMIN B 12: 831 pg/mL (ref 200–1100)

## 2017-01-08 ENCOUNTER — Telehealth: Payer: Self-pay

## 2017-01-08 DIAGNOSIS — E042 Nontoxic multinodular goiter: Secondary | ICD-10-CM

## 2017-01-08 LAB — IPS PAP TEST WITH HPV

## 2017-01-08 MED ORDER — METRONIDAZOLE 500 MG PO TABS: 500.0000 mg | ORAL_TABLET | Freq: Two times a day (BID) | ORAL | 0 refills | Status: DC

## 2017-01-08 NOTE — Telephone Encounter (Signed)
Spoke with patient. Advised of all results as seen below from Dr.Jertson. Patient verbalizes understanding. States that she has been having some vaginal symptoms and would like to be treated for BV with Flagyl. Rx for Flagyl 500 mg BID x 7 days #14 0RF sent to pharmacy on file. Avoid alcohol during treatment and 24 hours after completing medication. Don't mix with alcohol if mixed can cause severe nausea, vomiting and abdominal cramping. Normal side effects of Flagyl may include metallic taste in mouth, slight nausea, headache, abdominal cramping, diarrhea and dizziness. Patient verbalizes understanding. 08 recall entered. Patient is aware referral has been placed to endocrinologist Dr.Balan and that our referrals coordinator will work on getting this scheduled and she will be notified. Patient is agreeable.  Routing to provider for final review. Patient agreeable to disposition. Will close encounter.

## 2017-01-08 NOTE — Telephone Encounter (Signed)
Left message to call Aalyiah Camberos at [phone]. Referral has been placed to Dr.Balan.  Notes recorded by Romualdo Bolk, MD on 01/08/2017 at 10:09 AM EDT Please let the patient know that her pap and hpv test are negative!! s/p leep last year. She needs a f/u pap and hpv in 1 year (08 recall).  Her pap did show BV, if symptomatic treat, if not no treatment is needed. Treat with flagyl (either oral or vaginal, her choice), no ETOH while on Flagyl.  Oral: Flagyl 500 mg BID x 7 days, or Vaginal: Metrogel, 1 applicator per vagina q day x 5 days.  ------  Notes recorded by Romualdo Bolk, MD on 01/07/2017 at 3:41 PM EDT I hadn't seen her blood work when I called her to review her u/s results. Her vit d is slightly low. She should increase her current vit d intake by 1,000 IU daily (long term).  Her B12 is normal.  Her TSH is slightly suppressed, but her other thyroid tests are normal  The rest of her blood work is normal. Please inform

## 2017-01-11 ENCOUNTER — Telehealth: Payer: Self-pay | Admitting: Obstetrics and Gynecology

## 2017-01-11 NOTE — Telephone Encounter (Signed)
Spoke with patient. Patient would like to schedule IUD insertion. LMP 01/10/17. Patient scheduled for 01/13/17 at 9am with Dr. Oscar La. Reviewed Cytotec instructions and advised to take Motrin 800 mg with food and water one hour before procedure. Patient verbalizes understanding and is agreeable to date and time.  Routing to provider for final review. Patient is agreeable to disposition. Will close encounter.   Cc: Braxton Feathers, Sharma Covert

## 2017-01-11 NOTE — Telephone Encounter (Signed)
Patient called and left a message after hours stating she started her menstrual cycle on Sunday evening, 01/11/16, around 6:30 PM and she is ready to get her IUD placed this week.

## 2017-01-12 ENCOUNTER — Telehealth: Payer: Self-pay | Admitting: Obstetrics and Gynecology

## 2017-01-12 NOTE — Telephone Encounter (Addendum)
Left message with Dennie Bible at [facility] to check on status of patient's referral.

## 2017-01-12 NOTE — Telephone Encounter (Signed)
Patient says someone was going to call to get her scheduled for a thyroid biopsy.

## 2017-01-13 ENCOUNTER — Ambulatory Visit (INDEPENDENT_AMBULATORY_CARE_PROVIDER_SITE_OTHER): Payer: BLUE CROSS/BLUE SHIELD | Admitting: Obstetrics and Gynecology

## 2017-01-13 ENCOUNTER — Encounter: Payer: Self-pay | Admitting: Obstetrics and Gynecology

## 2017-01-13 VITALS — BP 138/80 | HR 84 | Resp 14 | Wt 166.0 lb

## 2017-01-13 DIAGNOSIS — Z309 Encounter for contraceptive management, unspecified: Secondary | ICD-10-CM

## 2017-01-13 DIAGNOSIS — Z01812 Encounter for preprocedural laboratory examination: Secondary | ICD-10-CM

## 2017-01-13 LAB — POCT URINE PREGNANCY: PREG TEST UR: NEGATIVE

## 2017-01-13 NOTE — Progress Notes (Signed)
GYNECOLOGY  VISIT   HPI: 31 y.o.   Single  Caucasian  female   G1P0010 with Patient's last menstrual period was 01/10/2017.   here for mirena IUD insertion  She was noted to have thyromegaly at her annual exam, bilateral nodules on u/s requiring biopsy. She has been set up to see Endocrinology  GYNECOLOGIC HISTORY: Patient's last menstrual period was 01/10/2017. Contraception:none  Menopausal hormone therapy: none        OB History    Gravida Para Term Preterm AB Living   1       1     SAB TAB Ectopic Multiple Live Births                     Patient Active Problem List   Diagnosis Date Noted  . History of cervical dysplasia 06/25/2016  . Family history of breast cancer 06/25/2016    Past Medical History:  Diagnosis Date  . Abnormal Pap smear of cervix   . Anxiety     Past Surgical History:  Procedure Laterality Date  . CERVICAL BIOPSY  W/ LOOP ELECTRODE EXCISION    . COLPOSCOPY      Current Outpatient Prescriptions  Medication Sig Dispense Refill  . LORazepam (ATIVAN) 1 MG tablet TK 1/2-1 T PO D  1  . metroNIDAZOLE (FLAGYL) 500 MG tablet Take 1 tablet (500 mg total) by mouth 2 (two) times daily. 14 tablet 0  . misoprostol (CYTOTEC) 200 MCG tablet Place 2 tablets vaginally 6-12 hours prior to your IUD insertion 2 tablet 0  . naproxen sodium (ANAPROX DS) 550 MG tablet Take 1 tablet (550 mg total) by mouth 2 (two) times daily with a meal. 30 tablet 2  . Prenatal Multivit-Min-Fe-FA (PRE-NATAL PO) Take by mouth.    . propranolol (INDERAL) 10 MG tablet TK 1 T PO QD PRA  1  . sertraline (ZOLOFT) 100 MG tablet Take 200 mg by mouth daily.  2  . valACYclovir (VALTREX) 1000 MG tablet TAKE 1 TABLET(1000 MG) BY MOUTH EVERY DAY 90 tablet 3   No current facility-administered medications for this visit.      ALLERGIES: Cephalosporins and Penicillins  Family History  Problem Relation Age of Onset  . Breast cancer Mother 21    age 33 and 69  . Lung cancer Maternal Grandmother    . Endometriosis Sister     Social History   Social History  . Marital status: Single    Spouse name: N/A  . Number of children: N/A  . Years of education: N/A   Occupational History  . Not on file.   Social History Main Topics  . Smoking status: Never Smoker  . Smokeless tobacco: Never Used  . Alcohol use 0.0 oz/week     Comment: 1-2 per month  . Drug use: No  . Sexual activity: Yes    Partners: Male    Birth control/ protection: None   Other Topics Concern  . Not on file   Social History Narrative  . No narrative on file    Review of Systems  Constitutional: Negative.   HENT: Negative.   Eyes: Negative.   Respiratory: Negative.   Cardiovascular: Negative.   Gastrointestinal: Negative.   Genitourinary: Negative.   Musculoskeletal: Negative.   Skin: Negative.   Neurological: Negative.   Endo/Heme/Allergies: Negative.   Psychiatric/Behavioral: Negative.     PHYSICAL EXAMINATION:    BP 138/80 (BP Location: Right Arm, Patient Position: Sitting, Cuff Size: Normal)  Pulse 84   Resp 14   Wt 166 lb (75.3 kg)   LMP 01/10/2017   BMI 25.62 kg/m     General appearance: alert, cooperative and appears stated age  Pelvic: External genitalia:  no lesions              Urethra:  normal appearing urethra with no masses, tenderness or lesions              Bartholins and Skenes: normal                 Vagina: normal appearing vagina with normal color and discharge, no lesions              Cervix: no lesions              Bimanual Exam:  Uterus:  normal size, contour, position, consistency, mobility, non-tender and anteverted              Adnexa: no mass, fullness, tenderness   The patient couldn't void secondary to anxiety. A straight cath ua was done, for approximately 170 cc. UPT negative.   The risks of the mirena IUD were reviewed with the patient, including infection, abnormal bleeding and uterine perfortion. Consent was signed.  A speculum was placed in the  vagina, the cervix was cleansed with betadine. A tenaculum was placed on the cervix, the uterus sounded to 7-8 cm. The cervix was dilated to a 5 Hagar dilator  The mirena IUD was inserted without difficulty. The string were cut to 4 cm. The tenaculum was removed. Slight oozing from the tenaculum site was stopped with pressure.   The patient tolerated the procedure well.                 Chaperone was present for exam.  ASSESSMENT Mirena IUD insertion    PLAN F/U in one month   An After Visit Summary was printed and given to the patient.

## 2017-01-13 NOTE — Patient Instructions (Signed)
IUD Post-procedure Instructions . Cramping is common.  You may take Ibuprofen, Aleve, or Tylenol for the cramping.  This should resolve within 24 hours.   . You may have a small amount of spotting.  You should wear a mini pad for the next few days. . You may have intercourse in 24 hours. . You need to call the office if you have any pelvic pain, fever, heavy bleeding, or foul smelling vaginal discharge. . Shower or bathe as normal Use a back up form of contraception for 1 week.  

## 2017-01-18 NOTE — Telephone Encounter (Signed)
Patient is scheduled to see Dr.Kumar on 01/27/2017. Patient is aware of this appointment.  Routing to provider for final review. Patient agreeable to disposition. Will close encounter.

## 2017-01-27 ENCOUNTER — Ambulatory Visit (INDEPENDENT_AMBULATORY_CARE_PROVIDER_SITE_OTHER): Payer: BLUE CROSS/BLUE SHIELD | Admitting: Endocrinology

## 2017-01-27 ENCOUNTER — Encounter: Payer: Self-pay | Admitting: Endocrinology

## 2017-01-27 VITALS — BP 122/84 | HR 89 | Ht 68.0 in | Wt 169.0 lb

## 2017-01-27 DIAGNOSIS — E0520 Thyrotoxicosis with toxic multinodular goiter without thyrotoxic crisis or storm: Secondary | ICD-10-CM | POA: Diagnosis not present

## 2017-01-27 LAB — TSH: TSH: 0.04 u[IU]/mL — AB (ref 0.35–4.50)

## 2017-01-27 LAB — T3, FREE: T3, Free: 4.1 pg/mL (ref 2.3–4.2)

## 2017-01-27 NOTE — Progress Notes (Addendum)
Patient ID: Jasmine Bush, female   DOB: Nov 02, 1985, 31 y.o.   MRN: [id number]             Referring physician: Gertie Exon   Reason for Appointment: Evaluation of thyroid nodules    History of Present Illness:   The patient's thyroid nodules were identified on her routine gynecologic exam in 01/2017 She was not aware of any swelling in her neck and has no previous history of thyroid disease  The thyroid ultrasound showed the following 3.3 cm solid isoechoic right midpole nodule (TR 3 nodule). This meets criteria for biopsy.  2.6 cm left inferior solid hypoechoic nodule (TR 4 nodule).   The patient also has been having problems with fatigue for at least a year and she things that can be a little worse now, she tends to go to sleep early in the evenings. She also thinks he has been having problems with palpitations last year and was started on propranolol by her psychiatrist with some relief of symptoms She tends to get nervous or shaky at times which is slightly better with propranolol She has had a long history of excessive sweating, does not think she is feeling warmer than usual recently She has gained about 15 pounds in the last months, she has previously had difficulty maintaining her weight  Wt Readings from Last 3 Encounters:  01/27/17 169 lb (76.7 kg)  01/13/17 166 lb (75.3 kg)  01/06/17 163 lb (73.9 kg)   THYROID labs:  Free thyroxine index was normal  Lab Results  Component Value Date   TSH 0.04 (L) 01/06/2017    Allergies as of 01/27/2017      Reactions   Cephalosporins Rash   Penicillins Rash      Medication List       Accurate as of 01/27/17 12:30 PM. Always use your most recent med list.          LORazepam 1 MG tablet Commonly known as:  ATIVAN TK 1/2-1 T PO D   metroNIDAZOLE 500 MG tablet Commonly known as:  FLAGYL Take 1 tablet (500 mg total) by mouth 2 (two) times daily.   misoprostol 200 MCG tablet Commonly known as:  CYTOTEC Place  2 tablets vaginally 6-12 hours prior to your IUD insertion   naproxen sodium 550 MG tablet Commonly known as:  ANAPROX DS Take 1 tablet (550 mg total) by mouth 2 (two) times daily with a meal.   PRE-NATAL PO Take by mouth.   propranolol 10 MG tablet Commonly known as:  INDERAL TK 1 T PO QD PRA   sertraline 100 MG tablet Commonly known as:  ZOLOFT Take 200 mg by mouth daily.   valACYclovir 1000 MG tablet Commonly known as:  VALTREX TAKE 1 TABLET(1000 MG) BY MOUTH EVERY DAY       Allergies:  Allergies  Allergen Reactions  . Cephalosporins Rash  . Penicillins Rash    Past Medical History:  Diagnosis Date  . Abnormal Pap smear of cervix   . Anxiety     There is no history of radiation to the neck in childhood  Past Surgical History:  Procedure Laterality Date  . CERVICAL BIOPSY  W/ LOOP ELECTRODE EXCISION    . COLPOSCOPY      Family History  Problem Relation Age of Onset  . Breast cancer Mother 32    age 87 and 57  . Lung cancer Maternal Grandmother   . Endometriosis Sister     Social History:  reports that she has never smoked. She has never used smokeless tobacco. She reports that she drinks alcohol. She reports that she does not use drugs.   Review of Systems  Constitutional: Positive for weight gain and diaphoresis.  Cardiovascular: Positive for palpitations.  Gastrointestinal: Negative for diarrhea.  Endocrine: Positive for fatigue. Negative for menstrual changes and heat intolerance.  Musculoskeletal: Negative for joint pain.  Skin: Negative for rash.  Neurological: Positive for tremors.  Psychiatric/Behavioral:       She has been treated for depression for about 8 months, symptoms are better     Examination:   LMP 01/10/2017    General Appearance:  well-looking, not unusually anxious        Eyes: No abnormal prominence or swelling of the eyes.  She has no stare but has mild lid lag         THYROID: Thyroid nodules are palpable on both  sides, the right-sided nodule is more superior and is about 3 cm in size Left-sided nodule is about 2-2.5 cm and lower and near the isthmus  There is no lymphadenopathy in the neck  Heart sounds normal Lungs clear Abdomen shows no hepatosplenomegaly or other mass.    Reflexes at ankles and biceps are brisk. No significant tremor present  Skin: No rash or lesions.  Her hands are significantly warm but not diaphoretic Extremities: No edema  Assessment/Plan:  Thyroid nodules: Patient has bilateral thyroid nodules with the right one being larger, this is relatively new However she has a TSH in the hyperthyroid range possibly indicating she may have hot nodule She also has signs and symptoms of hyperthyroidism for the last several months, some of these may be masked by her taking propranolol  Discussed with the patient the need for evaluation of her hyperthyroidism further with a free T3 and thyrotropin receptor antibody test He will also get a thyroid scan to identify if she has a hot nodule  Further management will depend on the results of above studies and will contact the patient with the plan  Consultation note sent to the referring physician  [facility] 01/27/2017   Addendum: Scan shows toxic nodular goiter with larger right hot nodule and uptake of 25%. I-131 ordered a dose of 29 mCi

## 2017-01-28 LAB — THYROTROPIN RECEPTOR AUTOABS: THYROTROPIN RECEPTOR AB: 0.89 IU/L (ref 0.00–1.75)

## 2017-02-10 ENCOUNTER — Ambulatory Visit (INDEPENDENT_AMBULATORY_CARE_PROVIDER_SITE_OTHER): Payer: BLUE CROSS/BLUE SHIELD | Admitting: Obstetrics and Gynecology

## 2017-02-10 ENCOUNTER — Encounter: Payer: Self-pay | Admitting: Obstetrics and Gynecology

## 2017-02-10 VITALS — BP 110/80 | HR 84 | Resp 16 | Wt 169.0 lb

## 2017-02-10 DIAGNOSIS — Z30431 Encounter for routine checking of intrauterine contraceptive device: Secondary | ICD-10-CM | POA: Diagnosis not present

## 2017-02-10 DIAGNOSIS — Z803 Family history of malignant neoplasm of breast: Secondary | ICD-10-CM | POA: Diagnosis not present

## 2017-02-10 NOTE — Progress Notes (Signed)
GYNECOLOGY  VISIT   HPI: 31 y.o.   Single  Caucasian  female   G1P0010 with Patient's last menstrual period was 02/08/2017.   here for  IUD check. She had a mirena IUD inserted last month. She she started her period 2 days ago and it is very light, minimal cramping (so much better than her normal cycle). No dyspareunia. She has further thyroid w/u later this week.    She has a family history of breast cancer in her mother (first at 3537). She brought in a copy of her genetic counseling from 2/16 for review.   GYNECOLOGIC HISTORY: Patient's last menstrual period was 02/08/2017. Contraception:IUD (Mirena) Menopausal hormone therapy: none         OB History    Gravida Para Term Preterm AB Living   1       1     SAB TAB Ectopic Multiple Live Births                     Patient Active Problem List   Diagnosis Date Noted  . History of cervical dysplasia 06/25/2016  . Family history of breast cancer 06/25/2016    Past Medical History:  Diagnosis Date  . Abnormal Pap smear of cervix   . Anxiety     Past Surgical History:  Procedure Laterality Date  . CERVICAL BIOPSY  W/ LOOP ELECTRODE EXCISION    . COLPOSCOPY    . INTRAUTERINE DEVICE (IUD) INSERTION  01/2017   Mirena     Current Outpatient Prescriptions  Medication Sig Dispense Refill  . levonorgestrel (MIRENA) 20 MCG/24HR IUD 1 each by Intrauterine route once.    Marland Kitchen. LORazepam (ATIVAN) 1 MG tablet TK 1/2-1 T PO D  1  . Multiple Vitamin (MULTIVITAMIN) capsule Take 1 capsule by mouth daily.    . naproxen sodium (ANAPROX DS) 550 MG tablet Take 1 tablet (550 mg total) by mouth 2 (two) times daily with a meal. 30 tablet 2  . propranolol (INDERAL) 10 MG tablet TK 1 T PO QD PRA  1  . sertraline (ZOLOFT) 100 MG tablet Take 200 mg by mouth daily.  2  . valACYclovir (VALTREX) 1000 MG tablet TAKE 1 TABLET(1000 MG) BY MOUTH EVERY DAY 90 tablet 3   No current facility-administered medications for this visit.      ALLERGIES:  Cephalosporins and Penicillins  Family History  Problem Relation Age of Onset  . Breast cancer Mother 7837    age 31 and 7654  . Lung cancer Maternal Grandmother   . Thyroid disease Maternal Grandmother   . Endometriosis Sister     Social History   Social History  . Marital status: Single    Spouse name: N/A  . Number of children: N/A  . Years of education: N/A   Occupational History  . Not on file.   Social History Main Topics  . Smoking status: Never Smoker  . Smokeless tobacco: Never Used  . Alcohol use 0.0 oz/week     Comment: 1-2 per month  . Drug use: No  . Sexual activity: Yes    Partners: Male    Birth control/ protection: None   Other Topics Concern  . Not on file   Social History Narrative  . No narrative on file    Review of Systems  Constitutional: Negative.   HENT: Negative.   Eyes: Negative.   Respiratory: Negative.   Cardiovascular: Negative.   Gastrointestinal: Negative.   Genitourinary: Negative.  Musculoskeletal: Negative.   Skin: Negative.   Neurological: Negative.   Endo/Heme/Allergies: Negative.   Psychiatric/Behavioral: Negative.     PHYSICAL EXAMINATION:    BP 110/80 (BP Location: Right Arm, Patient Position: Sitting, Cuff Size: Normal)   Pulse 84   Resp 16   Wt 169 lb (76.7 kg)   LMP 02/08/2017   BMI 25.70 kg/m     General appearance: alert, cooperative and appears stated age  Pelvic: External genitalia:  no lesions              Urethra:  normal appearing urethra with no masses, tenderness or lesions              Bartholins and Skenes: normal                 Vagina: normal appearing vagina with normal color and discharge, no lesions              Cervix: no lesions and IUD string 3-4 cm              Bimanual Exam:  Uterus:  normal size, contour, position, consistency, mobility, non-tender              Adnexa: no mass, fullness, tenderness               Chaperone was present for exam.  Genetic counseling report reviewed,  recommendations: Monthly breast self exam 2 annual clinical breast exams Routine pelvic exams Yearly mammogram starting at 30 Yearly breast MRI   Based on the Tyrer-Cuzik model, she has a 32-40% life time risk of breast cancer.   ASSESSMENT IUD check, doing well Family history of breast cancer   PLAN Routine f/u Will get her set up for a breast MRI as per the recommendations in her prior genetics evaluation.  She should also have a 6 month interval breast exam in 10/18. She had a mammogram in 10/17   An After Visit Summary was printed and given to the patient.  CC: Jasmine Bush to coordinate testing

## 2017-02-11 ENCOUNTER — Encounter (HOSPITAL_COMMUNITY): Payer: BLUE CROSS/BLUE SHIELD

## 2017-02-12 ENCOUNTER — Encounter (HOSPITAL_COMMUNITY): Payer: BLUE CROSS/BLUE SHIELD

## 2017-02-23 ENCOUNTER — Telehealth: Payer: Self-pay

## 2017-02-23 ENCOUNTER — Encounter (HOSPITAL_COMMUNITY)
Admission: RE | Admit: 2017-02-23 | Discharge: 2017-02-23 | Disposition: A | Discharge status: Home / Self Care | Payer: BLUE CROSS/BLUE SHIELD | Source: Ambulatory Visit | Attending: Endocrinology | Admitting: Endocrinology

## 2017-02-23 DIAGNOSIS — Z803 Family history of malignant neoplasm of breast: Secondary | ICD-10-CM

## 2017-02-23 DIAGNOSIS — E0520 Thyrotoxicosis with toxic multinodular goiter without thyrotoxic crisis or storm: Secondary | ICD-10-CM

## 2017-02-23 MED ORDER — SODIUM IODIDE I 131 CAPSULE
14.2000 | Freq: Once | INTRAVENOUS | Status: AC | PRN
  Administered 2017-02-23: 14.2 via ORAL

## 2017-02-23 NOTE — Telephone Encounter (Signed)
Jasmine Bush, Jasmine Evelyn, MD  [name], [name], RN        Please see the note from today.  Please set her up for Bilateral breast MRI, elevated risk of breast cancer.  Please also set her up for a 6 month breast exam here, due in 10/18.  Thanks,  Jasmine LarssonJill    Order for bilateral breast MRI w wo contrast placed for preauthorization prior to scheduling. Routing to [employer]Suzy Dixon.

## 2017-02-24 ENCOUNTER — Ambulatory Visit (HOSPITAL_COMMUNITY)
Admission: RE | Admit: 2017-02-24 | Discharge: 2017-02-24 | Disposition: A | Discharge status: Home / Self Care | Payer: BLUE CROSS/BLUE SHIELD | Source: Ambulatory Visit | Attending: Endocrinology | Admitting: Endocrinology

## 2017-02-24 DIAGNOSIS — E0520 Thyrotoxicosis with toxic multinodular goiter without thyrotoxic crisis or storm: Secondary | ICD-10-CM | POA: Insufficient documentation

## 2017-02-24 MED ORDER — SODIUM PERTECHNETATE TC 99M INJECTION
9.8000 | Freq: Once | INTRAVENOUS | Status: AC | PRN
  Administered 2017-02-24: 9.8 via INTRAVENOUS

## 2017-02-24 NOTE — Progress Notes (Signed)
Please call to let patient know that she has overactive areas on each side of the thyroid and will need radioactive iodine treatment.  If agreeable will order the treatment and she will also need to make an appointment to follow up in 1 month with labs

## 2017-02-25 ENCOUNTER — Telehealth: Payer: Self-pay | Admitting: Endocrinology

## 2017-02-25 NOTE — Telephone Encounter (Signed)
[facility] about test results. Please call patient back and advise. OK to leave message if no answer.

## 2017-02-26 ENCOUNTER — Other Ambulatory Visit: Payer: Self-pay | Admitting: Endocrinology

## 2017-02-26 DIAGNOSIS — E0520 Thyrotoxicosis with toxic multinodular goiter without thyrotoxic crisis or storm: Secondary | ICD-10-CM

## 2017-02-26 NOTE — Telephone Encounter (Signed)
Called and spoke to patient and went over lab results. Patient will have a radioactive iodine treatment scheduled and she will call our office back to have a one month follow up appointment made with Dr. Lucianne MussKumar to be seen after her iodine treatment.

## 2017-02-26 NOTE — Telephone Encounter (Signed)
Patient attempting to return call.

## 2017-03-03 ENCOUNTER — Encounter: Payer: Self-pay | Admitting: Endocrinology

## 2017-03-04 ENCOUNTER — Telehealth: Payer: Self-pay | Admitting: Endocrinology

## 2017-03-04 NOTE — Telephone Encounter (Signed)
Patient called to check the status of her RAI Therapy referral that was placed 02-26-17. Please check on this for the patient and call to advise today if possible. Okay to leave a detailed message on patient's phone.

## 2017-03-04 NOTE — Telephone Encounter (Signed)
Patient need an order for the breast MRI to go to [facility] because of insurance purposes.

## 2017-03-04 NOTE — Telephone Encounter (Signed)
Patient is asking if out office does "nuclear therapy"? Patient said "I was told that my order was sent to your office".

## 2017-03-04 NOTE — Telephone Encounter (Signed)
Spoke with patient. Patient states that she was contacted about testing for her thyroid, but that this was ordered by Dr.Kumar and this is being worked on. Patient was advised by her insurance company that she will need to have breast MRI at [facility] [phone] fax [phone]. Advised will [facility] so that order can be sent and she can be scheduled. Patient is agreeable.  Cc: Harland DingwallSuzy Dixon

## 2017-03-04 NOTE — Telephone Encounter (Signed)
The referral for radioactive iodine treatment was done for [facility] and I cannot understand why this has not been scheduled, please look into this and call nuclear medicine/ radiology directly

## 2017-03-05 NOTE — Telephone Encounter (Signed)
[facility] for [facility] with [facility]

## 2017-03-08 ENCOUNTER — Telehealth: Payer: Self-pay | Admitting: Obstetrics and Gynecology

## 2017-03-08 NOTE — Telephone Encounter (Signed)
[facility] asking for [facility] for her diagnostic bilateral MMG and an authorization for the MRI. [facility] is asking that the patient demographic and insurance be included with the order and authorization.  [phone]

## 2017-03-09 NOTE — Telephone Encounter (Signed)
[facility]. Patient must have a mammogram within 6 months of having a breast MRI in order for the radiologist to read the MRI. Call to patient to see how she would like to proceed as last MMG was performed on 07/10/[facility].

## 2017-03-09 NOTE — Telephone Encounter (Signed)
Spoke with patient. Patient is agreeable to have mammogram imaging at [facility] as well due to insurance reasons. Order to Dr.Jertson for review and signature before faxing.

## 2017-03-10 NOTE — Telephone Encounter (Signed)
Order for breast imaging signed and [facility] along with insurance information, demographics, and MRI authorization number to [phone]. Patient will be contacted to schedule by [facility].

## 2017-03-11 ENCOUNTER — Telehealth: Payer: Self-pay | Admitting: Obstetrics and Gynecology

## 2017-03-11 NOTE — Telephone Encounter (Signed)
Jeannie @[facility]  is calling again to an order faxed for this patient's MRI. Jeannie accidentally deleted the order. FAX: [phone]

## 2017-03-11 NOTE — Telephone Encounter (Signed)
Order for Breast MRI, bilateral screening mammogram, demographics and insurance information faxed to [facility] at [phone] with cover sheet and confirmation to ensure all orders and all information are received by [facility].  Routing to provider for final review. Patient agreeable to disposition. Will close encounter.

## 2017-03-11 NOTE — Telephone Encounter (Signed)
New order for breast MRI bilateral w wo contrast to Dr.Jertson for signature and refax to [facility].

## 2017-03-18 ENCOUNTER — Ambulatory Visit (HOSPITAL_COMMUNITY)
Admission: RE | Admit: 2017-03-18 | Discharge: 2017-03-18 | Disposition: A | Discharge status: Home / Self Care | Payer: BLUE CROSS/BLUE SHIELD | Source: Ambulatory Visit | Attending: Endocrinology | Admitting: Endocrinology

## 2017-03-18 DIAGNOSIS — E0520 Thyrotoxicosis with toxic multinodular goiter without thyrotoxic crisis or storm: Secondary | ICD-10-CM | POA: Diagnosis present

## 2017-03-18 LAB — HCG, SERUM, QUALITATIVE: Preg, Serum: NEGATIVE

## 2017-03-18 MED ORDER — SODIUM IODIDE I 131 CAPSULE
29.1000 | Freq: Once | INTRAVENOUS | Status: AC | PRN
  Administered 2017-03-18: 29.1 via ORAL

## 2017-03-19 ENCOUNTER — Telehealth: Payer: Self-pay | Admitting: Endocrinology

## 2017-03-19 NOTE — Telephone Encounter (Signed)
LM for pt to call back to schedule one month visit and labs 3 days prior

## 2017-03-22 ENCOUNTER — Other Ambulatory Visit: Payer: Self-pay | Admitting: Endocrinology

## 2017-03-22 DIAGNOSIS — E0520 Thyrotoxicosis with toxic multinodular goiter without thyrotoxic crisis or storm: Secondary | ICD-10-CM

## 2017-03-22 NOTE — Progress Notes (Signed)
Please call to schedule her follow-up appointment in 4 weeks with labs

## 2017-04-12 ENCOUNTER — Telehealth: Payer: Self-pay | Admitting: Obstetrics and Gynecology

## 2017-04-12 NOTE — Telephone Encounter (Signed)
Patient called and requested to speak to the nurse. She said she just received a call that she needs to schedule another breast MRI but she is not sure why. She said she just had one and this concerns her.

## 2017-04-12 NOTE — Telephone Encounter (Signed)
Frost SinkRita calling from [facility]. Was advised patient is scheduled for breast MRI on 04/15/17, no reports received, requesting imaging reports. Advised per review of EPIC, MMG and breast MRI completed [facility] [place]. Appointment for 7/12 cancelled. Will return call to patient.

## 2017-04-12 NOTE — Telephone Encounter (Signed)
Spoke with patient. Patient states she was not aware of an additional breast MRI was needed, did change locations for imaging initially, may be where confusion was. Advised patient per review of EPIC, no additional imagining recommended at this time, 04/15/17 breast MRI cancelled at [facility]. Follow with recommendations from 03/25/17 breast imaging. Advised Dr. Oscar LaJertson will review, will return call with any additional recommendations. Patient is agreeable.  Routing to provider for final review. Patient is agreeable to disposition. Will close encounter.

## 2017-04-27 ENCOUNTER — Other Ambulatory Visit (INDEPENDENT_AMBULATORY_CARE_PROVIDER_SITE_OTHER): Payer: BLUE CROSS/BLUE SHIELD

## 2017-04-27 DIAGNOSIS — E0520 Thyrotoxicosis with toxic multinodular goiter without thyrotoxic crisis or storm: Secondary | ICD-10-CM | POA: Diagnosis not present

## 2017-04-27 LAB — T3, FREE: T3, Free: 2.6 pg/mL (ref 2.3–4.2)

## 2017-04-27 LAB — T4, FREE: FREE T4: 0.57 ng/dL — AB (ref 0.60–1.60)

## 2017-04-28 ENCOUNTER — Other Ambulatory Visit: Payer: Self-pay | Admitting: Endocrinology

## 2017-04-28 DIAGNOSIS — E0520 Thyrotoxicosis with toxic multinodular goiter without thyrotoxic crisis or storm: Secondary | ICD-10-CM

## 2017-04-28 LAB — TSH: TSH: 0.03 u[IU]/mL — ABNORMAL LOW (ref 0.35–4.50)

## 2017-04-30 ENCOUNTER — Ambulatory Visit (INDEPENDENT_AMBULATORY_CARE_PROVIDER_SITE_OTHER): Payer: BLUE CROSS/BLUE SHIELD | Admitting: Endocrinology

## 2017-04-30 ENCOUNTER — Encounter: Payer: Self-pay | Admitting: Endocrinology

## 2017-04-30 VITALS — BP 120/80 | HR 91 | Ht 68.0 in | Wt 171.4 lb

## 2017-04-30 DIAGNOSIS — E0520 Thyrotoxicosis with toxic multinodular goiter without thyrotoxic crisis or storm: Secondary | ICD-10-CM

## 2017-04-30 MED ORDER — LEVOTHYROXINE SODIUM 25 MCG PO TABS: 25.0000 ug | ORAL_TABLET | Freq: Every day | ORAL | 3 refills | Status: DC

## 2017-04-30 NOTE — Progress Notes (Signed)
Patient ID: Ma HillockMarissa R Bush, female   DOB: 1986/07/30, 31 y.o.   MRN: [id number]             Referring physician: Gertie ExonJill Jertson   Reason for Appointment: Follow-up of thyroid    History of Present Illness:   History obtained on her initial consultation: The patient's thyroid nodules were identified on her routine gynecologic exam in 01/2017 She was not aware of any swelling in her neck and has no previous history of thyroid disease  The thyroid ultrasound showed the following 3.3 cm solid isoechoic right midpole nodule (TR 3 nodule) 2.6 cm left inferior solid hypoechoic nodule (TR 4 nodule).   The patient also had been having problems with fatigue for at least a year and she things that can be a little worse now, she tends to go to sleep early in the evenings..  She also thinks he has been having problems with palpitations last year and was started on propranolol by her psychiatrist with some relief of symptoms.  She tends to get nervous or shaky at times which is slightly better with propranolol She has had a long history of excessive sweating, does not think she is feeling warmer than usual recently She had gained about 15 pounds in the last few months, she has previously had difficulty maintaining her weight  FOLLOW-up history:  She was found to have a toxic nodular goiter on evaluation with hard nodules at the upper right pole and also lower left lobe, I-131 uptake 45% Baseline free T3 was upper normal at 4.1.  Free thyroxine index was normal   On 03/18/17 she was treated with 29 mCi  Since then she has had less palpitations, no shakiness now and does not feel as hot and sweaty and her weight has leveled off She does feel more tired for the last 2 weeks She has noticed that her thyroid nodule on the right side has recessed in size  Wt Readings from Last 3 Encounters:  04/30/17 171 lb 6.4 oz (77.7 kg)  02/10/17 169 lb (76.7 kg)  01/27/17 169 lb (76.7 kg)   THYROID  labs:    Lab Results  Component Value Date   FREET4 0.57 (L) 04/27/2017   TSH 0.03 (L) 04/28/2017   TSH 0.04 (L) 01/27/2017   TSH 0.04 (L) 01/06/2017   Lab Results  Component Value Date   T3FREE 2.6 04/27/2017   T3FREE 4.1 01/27/2017     Allergies as of 04/30/2017      Reactions   Cephalosporins Rash   Penicillins Rash      Medication List       Accurate as of 04/30/17  3:46 PM. Always use your most recent med list.          levonorgestrel 20 MCG/24HR IUD Commonly known as:  MIRENA 1 each by Intrauterine route once.   LORazepam 1 MG tablet Commonly known as:  ATIVAN TK 1/2-1 T PO D   multivitamin capsule Take 1 capsule by mouth daily.   naproxen sodium 550 MG tablet Commonly known as:  ANAPROX DS Take 1 tablet (550 mg total) by mouth 2 (two) times daily with a meal.   propranolol 10 MG tablet Commonly known as:  INDERAL TK 1 T PO QD PRA   sertraline 100 MG tablet Commonly known as:  ZOLOFT Take 200 mg by mouth daily.   valACYclovir 1000 MG tablet Commonly known as:  VALTREX TAKE 1 TABLET(1000 MG) BY MOUTH EVERY DAY   vitamin  B-12 250 MCG tablet Commonly known as:  CYANOCOBALAMIN Take 250 mcg by mouth daily.   Vitamin D3 1000 units Caps Take 1 capsule by mouth daily.       Allergies:  Allergies  Allergen Reactions  . Cephalosporins Rash  . Penicillins Rash    Past Medical History:  Diagnosis Date  . Abnormal Pap smear of cervix   . Anxiety     There is no history of radiation to the neck in childhood  Past Surgical History:  Procedure Laterality Date  . CERVICAL BIOPSY  W/ LOOP ELECTRODE EXCISION    . COLPOSCOPY    . INTRAUTERINE DEVICE (IUD) INSERTION  01/2017   Mirena     Family History  Problem Relation Age of Onset  . Breast cancer Mother 7937       age 31 and 2354  . Lung cancer Maternal Grandmother   . Thyroid disease Maternal Grandmother   . Endometriosis Sister     Social History:  reports that she has never smoked.  She has never used smokeless tobacco. She reports that she drinks alcohol. She reports that she does not use drugs.   Review of Systems     Examination:   BP 120/80   Pulse 91   Ht 5\' 8"  (1.727 m)   Wt 171 lb 6.4 oz (77.7 kg)   SpO2 97%   BMI 26.06 kg/m        THYROID: Right-sided thyroid nodule is just palpable, about 1.5 cm  Left-sided nodule is about 2 cm in size and firm  Hands are not unusually warm Biceps/triceps reflexes appear normal   Assessment/Plan:  Toxic nodular goiter:  Patient has had successful treatment of her toxic nodular goiter with I-131 treatment done in June She is symptomatically better Her overactive nodules particularly the right side have gone down in size very significantly  She is complaining of fatigue and her free T4 is now low at 0.57, T3 is normal  Currently the patient maybe mildly hypothyroid although not clear if this is transient following I-131 treatment and stunning of the thyroid However since she is complaining of fatigue we will treat her with 25 g of levothyroxine until her next visit Labs including TSH to be reassessed at her follow-up in about a month  Jasmine Bush 04/30/2017   Addendum: Scan shows toxic nodular goiter with larger right hot nodule and uptake of 25%. I-131 ordered a dose of 29 mCi

## 2017-05-01 DIAGNOSIS — E0520 Thyrotoxicosis with toxic multinodular goiter without thyrotoxic crisis or storm: Secondary | ICD-10-CM | POA: Insufficient documentation

## 2017-06-11 ENCOUNTER — Other Ambulatory Visit (INDEPENDENT_AMBULATORY_CARE_PROVIDER_SITE_OTHER): Payer: BLUE CROSS/BLUE SHIELD

## 2017-06-11 DIAGNOSIS — E0520 Thyrotoxicosis with toxic multinodular goiter without thyrotoxic crisis or storm: Secondary | ICD-10-CM

## 2017-06-11 LAB — T4, FREE: FREE T4: 0.7 ng/dL (ref 0.60–1.60)

## 2017-06-11 LAB — TSH: TSH: 6.43 u[IU]/mL — ABNORMAL HIGH (ref 0.35–4.50)

## 2017-06-15 ENCOUNTER — Ambulatory Visit (INDEPENDENT_AMBULATORY_CARE_PROVIDER_SITE_OTHER): Payer: BLUE CROSS/BLUE SHIELD | Admitting: Endocrinology

## 2017-06-15 ENCOUNTER — Encounter: Payer: Self-pay | Admitting: Endocrinology

## 2017-06-15 VITALS — BP 138/88 | HR 79 | Ht 68.0 in | Wt 181.4 lb

## 2017-06-15 DIAGNOSIS — E890 Postprocedural hypothyroidism: Secondary | ICD-10-CM | POA: Diagnosis not present

## 2017-06-15 MED ORDER — LEVOTHYROXINE SODIUM 50 MCG PO TABS: 50.0000 ug | ORAL_TABLET | Freq: Every day | ORAL | 3 refills | Status: DC

## 2017-06-15 NOTE — Progress Notes (Signed)
Patient ID: Jasmine Bush, female   DOB: [birth year], 31 y.o.   MRN: [id number]             Referring physician: Gertie Exon   Reason for Appointment: Follow-up of thyroid    History of Present Illness:   History obtained on her initial consultation: The patient's thyroid nodules were identified on her routine gynecologic exam in 01/2017 She was not aware of any swelling in her neck and has no previous history of thyroid disease  The thyroid ultrasound showed the following 3.3 cm solid isoechoic right midpole nodule (TR 3 nodule) 2.6 cm left inferior solid hypoechoic nodule (TR 4 nodule).   The patient also had been having problems with fatigue for at least a year and she things that can be a little worse now, she tends to go to sleep early in the evenings..  She also thinks he has been having problems with palpitations last year and was started on propranolol by her psychiatrist with some relief of symptoms.  She tends to get nervous or shaky at times which is slightly better with propranolol She has had a long history of excessive sweating, does not think she is feeling warmer than usual recently She had gained about 15 pounds in the last few months, she has previously had difficulty maintaining her weight  FOLLOW-up history:  She was found to have a toxic nodular goiter on evaluation with hard nodules at the upper right pole and also lower left lobe, I-131 uptake 45% Baseline free T3 was upper normal at 4.1.  Free thyroxine index was normal   On 03/18/17 she was treated with 29 mCi  Her symptoms of hyperthyroidism resolved after the treatment She was starting to feel fatigued on her last visit in 7/18 and she continues to feel this way Even with starting levothyroxine she does not feel any better She has also at times feeling slightly confused and having difficulty with memory Her weight has gone up significantly  She has mild cold intolerance also   Wt Readings from Last  3 Encounters:  06/15/17 181 lb 6.4 oz (82.3 kg)  04/30/17 171 lb 6.4 oz (77.7 kg)  02/10/17 169 lb (76.7 kg)   THYROID labs:  Free T4 back to normal but TSH is now 6.4  Lab Results  Component Value Date   FREET4 0.70 06/11/2017   FREET4 0.57 (L) 04/27/2017   TSH 6.43 (H) 06/11/2017   TSH 0.03 (L) 04/28/2017   TSH 0.04 (L) 01/27/2017   Lab Results  Component Value Date   T3FREE 2.6 04/27/2017   T3FREE 4.1 01/27/2017     Allergies as of 06/15/2017      Reactions   Cephalosporins Rash   Penicillins Rash      Medication List       Accurate as of 06/15/17  4:39 PM. Always use your most recent med list.          levonorgestrel 20 MCG/24HR IUD Commonly known as:  MIRENA 1 each by Intrauterine route once.   levothyroxine 25 MCG tablet Commonly known as:  SYNTHROID Take 1 tablet (25 mcg total) by mouth daily before breakfast.   LORazepam 1 MG tablet Commonly known as:  ATIVAN TK 1/2-1 T PO D   multivitamin capsule Take 1 capsule by mouth daily.   naproxen sodium 550 MG tablet Commonly known as:  ANAPROX DS Take 1 tablet (550 mg total) by mouth 2 (two) times daily with a meal.   propranolol  10 MG tablet Commonly known as:  INDERAL TK 1 T PO QD PRA   sertraline 100 MG tablet Commonly known as:  ZOLOFT Take 200 mg by mouth daily.   valACYclovir 1000 MG tablet Commonly known as:  VALTREX TAKE 1 TABLET(1000 MG) BY MOUTH EVERY DAY   vitamin B-12 250 MCG tablet Commonly known as:  CYANOCOBALAMIN Take 250 mcg by mouth daily.   Vitamin D3 1000 units Caps Take 1 capsule by mouth daily.       Allergies:  Allergies  Allergen Reactions  . Cephalosporins Rash  . Penicillins Rash    Past Medical History:  Diagnosis Date  . Abnormal Pap smear of cervix   . Anxiety     There is no history of radiation to the neck in childhood  Past Surgical History:  Procedure Laterality Date  . CERVICAL BIOPSY  W/ LOOP ELECTRODE EXCISION    . COLPOSCOPY    .  INTRAUTERINE DEVICE (IUD) INSERTION  01/2017   Mirena     Family History  Problem Relation Age of Onset  . Breast cancer Mother 6037       age 31 and 3054  . Lung cancer Maternal Grandmother   . Thyroid disease Maternal Grandmother   . Endometriosis Sister     Social History:  reports that she has never smoked. She has never used smokeless tobacco. She reports that she drinks alcohol. She reports that she does not use drugs.   Review of Systems   Examination:   BP 138/88   Pulse 79   Ht 5\' 8"  (1.727 m)   Wt 181 lb 6.4 oz (82.3 kg)   SpO2 98%   BMI 27.58 kg/m        THYROID: Right-sided thyroid nodule is barely palpable and probably about 0.5 cm Left-sided nodule is about 1-1.5 cm in size and somewhat more deep-seated, slightly firm  Hands are not unusually cold Biceps/triceps reflexes appear normal   Assessment/Plan:   Toxic nodular goiter:  Patient has had successful treatment of her toxic nodular goiter with I-131 treatment done in June She is symptomatically better  The thyroid size has gone down further  However she continues to have fatigue and now complaining of some difficulty with mental function at times Surprisingly even with starting levothyroxine her TSH has gone up above 6 indicating progressing hypothyroidism Most likely she is going to have long-term hypothyroidism with no residual function in the remaining thyroid areas  She will increase her levothyroxine to 50 g and follow-up again in one month She will call if she is getting worsening of her symptoms  [facility]Jonatan Wilsey 06/15/2017

## 2017-06-15 NOTE — Patient Instructions (Signed)
Take 2 tabs of 25 dose

## 2017-07-12 ENCOUNTER — Encounter: Payer: Self-pay | Admitting: Obstetrics and Gynecology

## 2017-07-27 ENCOUNTER — Other Ambulatory Visit (INDEPENDENT_AMBULATORY_CARE_PROVIDER_SITE_OTHER): Payer: BLUE CROSS/BLUE SHIELD

## 2017-07-27 DIAGNOSIS — E890 Postprocedural hypothyroidism: Secondary | ICD-10-CM

## 2017-07-27 LAB — TSH: TSH: 2.62 u[IU]/mL (ref 0.35–4.50)

## 2017-07-27 LAB — T4, FREE: FREE T4: 0.73 ng/dL (ref 0.60–1.60)

## 2017-07-30 ENCOUNTER — Ambulatory Visit (INDEPENDENT_AMBULATORY_CARE_PROVIDER_SITE_OTHER): Payer: BLUE CROSS/BLUE SHIELD | Admitting: Endocrinology

## 2017-07-30 ENCOUNTER — Encounter: Payer: Self-pay | Admitting: Endocrinology

## 2017-07-30 VITALS — BP 120/68 | HR 86 | Ht 68.0 in | Wt 182.6 lb

## 2017-07-30 DIAGNOSIS — E890 Postprocedural hypothyroidism: Secondary | ICD-10-CM

## 2017-07-30 LAB — T3, FREE: T3, Free: 3.6 pg/mL (ref 2.3–4.2)

## 2017-07-30 NOTE — Progress Notes (Signed)
Patient ID: Jasmine Bush, female   DOB: October 24, 1985, 31 y.o.   MRN: [id number]             Referring physician: Gertie Exon   Reason for Appointment: Follow-up of thyroid    History of Present Illness:   History obtained on her initial consultation: The patient's thyroid nodules were identified on her routine gynecologic exam in 01/2017 She was not aware of any swelling in her neck and has no previous history of thyroid disease  The thyroid ultrasound showed the following 3.3 cm solid isoechoic right midpole nodule (TR 3 nodule) 2.6 cm left inferior solid hypoechoic nodule (TR 4 nodule).   The patient also had been having problems with fatigue for at least a year and she things that can be a little worse now, she tends to go to sleep early in the evenings..  She also thinks he has been having problems with palpitations last year and was started on propranolol by her psychiatrist with some relief of symptoms.  She tends to get nervous or shaky at times which is slightly better with propranolol She has had a long history of excessive sweating, does not think she is feeling warmer than usual recently She had gained about 15 pounds in the last few months, she has previously had difficulty maintaining her weight  FOLLOW-up history:  She was found to have a toxic nodular goiter on evaluation with hot nodules at the upper right pole and also lower left lobe, I-131 uptake 45% Baseline free T3 was upper normal at 4.1.  Free thyroxine index was normal   On 03/18/17 she was treated with 29 mCi  She was starting to feel fatigued in July and she became hypothyroid She was started on levothyroxine 25 g and this was increased up to 50 g in 9/18 when her TSH was 6.4 and she was having continued fatigue She also had complained of feeling a little confused at times and memory problems She now says that with increasing the dose to 50 g she felt better for a couple of weeks but is back to  baseline Also asking about a little hoarseness in her voice at times No hair loss She is concerned about difficulty losing weight   She has cold intolerance also   Wt Readings from Last 3 Encounters:  07/30/17 182 lb 9.6 oz (82.8 kg)  06/15/17 181 lb 6.4 oz (82.3 kg)  04/30/17 171 lb 6.4 oz (77.7 kg)   THYROID labs:  Free T4 And TSH back to normal    Lab Results  Component Value Date   FREET4 0.73 07/27/2017   FREET4 0.70 06/11/2017   FREET4 0.57 (L) 04/27/2017   TSH 2.62 07/27/2017   TSH 6.43 (H) 06/11/2017   TSH 0.03 (L) 04/28/2017   Lab Results  Component Value Date   T3FREE 2.6 04/27/2017   T3FREE 4.1 01/27/2017     Allergies as of 07/30/2017      Reactions   Cephalosporins Rash   Penicillins Rash      Medication List       Accurate as of 07/30/17 10:16 AM. Always use your most recent med list.          levonorgestrel 20 MCG/24HR IUD Commonly known as:  MIRENA 1 each by Intrauterine route once.   levothyroxine 50 MCG tablet Commonly known as:  SYNTHROID, LEVOTHROID Take 1 tablet (50 mcg total) by mouth daily.   LORazepam 1 MG tablet Commonly known as:  ATIVAN  TK 1/2-1 T PO D   multivitamin capsule Take 1 capsule by mouth daily.   naproxen sodium 550 MG tablet Commonly known as:  ANAPROX DS Take 1 tablet (550 mg total) by mouth 2 (two) times daily with a meal.   propranolol 10 MG tablet Commonly known as:  INDERAL TK 1 T PO QD PRA   sertraline 100 MG tablet Commonly known as:  ZOLOFT Take 200 mg by mouth daily.   valACYclovir 1000 MG tablet Commonly known as:  VALTREX TAKE 1 TABLET(1000 MG) BY MOUTH EVERY DAY   vitamin B-12 250 MCG tablet Commonly known as:  CYANOCOBALAMIN Take 250 mcg by mouth daily.   Vitamin D3 1000 units Caps Take 1 capsule by mouth daily.       Allergies:  Allergies  Allergen Reactions  . Cephalosporins Rash  . Penicillins Rash    Past Medical History:  Diagnosis Date  . Abnormal Pap smear of  cervix   . Anxiety     There is no history of radiation to the neck in childhood  Past Surgical History:  Procedure Laterality Date  . CERVICAL BIOPSY  W/ LOOP ELECTRODE EXCISION    . COLPOSCOPY    . INTRAUTERINE DEVICE (IUD) INSERTION  01/2017   Mirena     Family History  Problem Relation Age of Onset  . Breast cancer Mother [id number]       age 237 and 6554  . Lung cancer Maternal Grandmother   . Thyroid disease Maternal Grandmother   . Endometriosis Sister     Social History:  reports that she has never smoked. She has never used smokeless tobacco. She reports that she drinks alcohol. She reports that she does not use drugs.   Review of Systems  HENT: Positive for hoarseness.   Endocrine: Positive for fatigue.     Examination:   BP 120/68   Pulse 86   Ht 5\' 8"  (1.727 m)   Wt 182 lb 9.6 oz (82.8 kg)   SpO2 98%   BMI 27.76 kg/m        THYROID:Not palpable on the right Left-sided nodule is about 1 cm in size and  Felt on swallowing mostly  skin appears normal  Hands are not unusually cold Biceps/triceps reflexes appear normal   Assessment/Plan:   Post ablative hypothyroidism:    She appears to have persistent hypothyroidism  The thyroid size has gone down further and is barely palpable   However  despite her TSH being back to normal with 50 g of levothyroxine she is starting to complain of fatigue and difficulty with memory function also  Discussed that that this may be possibly related to an imbalance of her T3 levels with levothyroxine since she was feeling fairly good initially after increasing her levothyroxine last month Will check her T3 level today and decide on possible combination therapy Otherwise she will have to review her symptoms with PCP  Follow-up in 6-8 weeks based on treatment plans  [facility],Cayson Kalb 07/30/2017

## 2017-08-02 ENCOUNTER — Encounter: Payer: Self-pay | Admitting: Endocrinology

## 2017-09-15 ENCOUNTER — Other Ambulatory Visit: Payer: Self-pay

## 2017-09-15 MED ORDER — LEVOTHYROXINE SODIUM 50 MCG PO TABS: 50.0000 ug | ORAL_TABLET | Freq: Every day | ORAL | 3 refills | Status: DC

## 2017-10-01 ENCOUNTER — Ambulatory Visit: Payer: BLUE CROSS/BLUE SHIELD | Admitting: Endocrinology

## 2017-12-24 ENCOUNTER — Telehealth: Payer: Self-pay | Admitting: *Deleted

## 2017-12-24 MED ORDER — LEVOTHYROXINE SODIUM 50 MCG PO TABS: 50.0000 ug | ORAL_TABLET | Freq: Every day | ORAL | 0 refills | Status: AC

## 2017-12-24 NOTE — Telephone Encounter (Signed)
Done. See meds. Will have scheduler contact pt to schedule OV.

## 2017-12-24 NOTE — Telephone Encounter (Signed)
Please give her only 15 tablets of the 50 mcg, 1 tablet daily with note to make appointment

## 2017-12-24 NOTE — Telephone Encounter (Signed)
Received refill req for Levothyroxine 25 mcg 1 qd. Her med list says she takes 50 mcg 1 qd.  LOV w/you: 07/30/17 No existing appts.    Please advise ok to Rf 25 mcg?

## 2018-01-03 ENCOUNTER — Telehealth: Payer: Self-pay | Admitting: Endocrinology

## 2018-01-11 NOTE — Telephone Encounter (Signed)
error 

## 2018-01-19 ENCOUNTER — Other Ambulatory Visit: Payer: Self-pay | Admitting: Endocrinology

## 2018-02-01 ENCOUNTER — Telehealth: Payer: Self-pay | Admitting: *Deleted

## 2018-02-01 NOTE — Telephone Encounter (Signed)
Left message for patient to call and schedule AEX/PAP. Patient is in 08 recall for 01/2018

## 2018-02-15 NOTE — Telephone Encounter (Signed)
Patient scheduled for 02-16-18 -eh

## 2018-02-16 ENCOUNTER — Other Ambulatory Visit: Payer: Self-pay

## 2018-02-16 ENCOUNTER — Encounter: Payer: Self-pay | Admitting: Obstetrics and Gynecology

## 2018-02-16 ENCOUNTER — Other Ambulatory Visit (HOSPITAL_COMMUNITY)
Admission: RE | Admit: 2018-02-16 | Discharge: 2018-02-16 | Disposition: A | Discharge status: Home / Self Care | Payer: BLUE CROSS/BLUE SHIELD | Source: Ambulatory Visit | Attending: Obstetrics and Gynecology | Admitting: Obstetrics and Gynecology

## 2018-02-16 ENCOUNTER — Ambulatory Visit: Payer: BLUE CROSS/BLUE SHIELD | Admitting: Obstetrics and Gynecology

## 2018-02-16 VITALS — BP 118/76 | HR 80 | Resp 14 | Ht 68.0 in | Wt 175.0 lb

## 2018-02-16 DIAGNOSIS — Z87410 Personal history of cervical dysplasia: Secondary | ICD-10-CM | POA: Diagnosis present

## 2018-02-16 DIAGNOSIS — Z7189 Other specified counseling: Secondary | ICD-10-CM | POA: Diagnosis not present

## 2018-02-16 DIAGNOSIS — Z01419 Encounter for gynecological examination (general) (routine) without abnormal findings: Secondary | ICD-10-CM

## 2018-02-16 DIAGNOSIS — Z124 Encounter for screening for malignant neoplasm of cervix: Secondary | ICD-10-CM

## 2018-02-16 DIAGNOSIS — E559 Vitamin D deficiency, unspecified: Secondary | ICD-10-CM

## 2018-02-16 DIAGNOSIS — Z30431 Encounter for routine checking of intrauterine contraceptive device: Secondary | ICD-10-CM | POA: Diagnosis not present

## 2018-02-16 DIAGNOSIS — Z7185 Encounter for immunization safety counseling: Secondary | ICD-10-CM

## 2018-02-16 DIAGNOSIS — Z803 Family history of malignant neoplasm of breast: Secondary | ICD-10-CM | POA: Diagnosis not present

## 2018-02-16 DIAGNOSIS — Z23 Encounter for immunization: Secondary | ICD-10-CM

## 2018-02-16 DIAGNOSIS — Z0000 Encounter for general adult medical examination without abnormal findings: Secondary | ICD-10-CM

## 2018-02-16 DIAGNOSIS — Z9189 Other specified personal risk factors, not elsewhere classified: Secondary | ICD-10-CM

## 2018-02-16 NOTE — Progress Notes (Signed)
Patient in today for 1st Gardasil injection.   Contraception: IUD LMP: 01/03/18 Last AEX: 02/16/18 with Dr. Oscar La  Injection given in Left Deltoid. Patient tolerated shot well.   Patient informed next injection due in about 2 months.  Advised patient, if not on birth control, to return for next injection with cycle.   Routed to provider for final review.  Encounter closed.

## 2018-02-16 NOTE — Progress Notes (Addendum)
32 y.o. G1P0010 SingleCaucasianF here for annual exam. She has a mirena IUD, placed in 4/18. Doing well. Spots every once in while. Cramps are pretty much gone.  Period Duration (Days): spotting 4-5 days  Period Pattern: (!) Irregular Menstrual Flow: Light Menstrual Control: Panty liner Dysmenorrhea: None  Married life is wonderful, no dyspareunia. Not sure she wants kids in the future.  The patient has a h/o toxic nodular goiter, treated with ablation. Post ablative hypothyroidism, on synthroid.   Mom had breast cancer at 30, BRCA negative. Was told to start getting mammograms this year. She had a breast MRI last year that was normal. She had an appointment with a genetic counselor, but canceled it. She had the counseling in [place]. Was told her lifetime risk was over 40%. She was told to have MRI's yearly until she was 30, then to get yearly mammograms.    Patient's last menstrual period was 01/03/2018.          Sexually active: Yes.    The current method of family planning is IUD.    Exercising: yes- yoga/ walking/hiking  Smoker:  no  Health Maintenance: Pap:  01-06-17 WNL NEG HR HPV 06-25-16 WNL neg HR HPV 10-15-15 HGSIL HX Colpo and LEEP  History of abnormal Pap:  yes MMG:  03-25-17 breast NRI normal  Colonoscopy:  Never BMD:   Never TDaP:  01-06-17 Gardasil: no    reports that she has never smoked. She has never used smokeless tobacco. She reports that she drank alcohol. She reports that she does not use drugs. Rare ETOH. She is a English as a second language teacher.   Past Medical History:  Diagnosis Date  . Abnormal Pap smear of cervix   . Anxiety   . Thyroid disease     Past Surgical History:  Procedure Laterality Date  . CERVICAL BIOPSY  W/ LOOP ELECTRODE EXCISION    . COLPOSCOPY    . INTRAUTERINE DEVICE (IUD) INSERTION  01/2017   Mirena     Current Outpatient Medications  Medication Sig Dispense Refill  . Cholecalciferol (VITAMIN D3) 1000 units CAPS Take 1 capsule by mouth daily.    Marland Kitchen  levonorgestrel (MIRENA) 20 MCG/24HR IUD 1 each by Intrauterine route once.    Marland Kitchen levothyroxine (SYNTHROID, LEVOTHROID) 50 MCG tablet Take 1 tablet (50 mcg total) by mouth daily. 15 tablet 0  . LORazepam (ATIVAN) 1 MG tablet TK 1/2-1 T PO D  1  . Multiple Vitamin (MULTIVITAMIN) capsule Take 1 capsule by mouth daily.    . naproxen sodium (ANAPROX DS) 550 MG tablet Take 1 tablet (550 mg total) by mouth 2 (two) times daily with a meal. (Patient taking differently: Take 550 mg by mouth as needed. ) 30 tablet 2  . propranolol (INDERAL) 10 MG tablet TK 1 T PO QD PRA  1  . sertraline (ZOLOFT) 100 MG tablet Take 200 mg by mouth daily.  2  . valACYclovir (VALTREX) 1000 MG tablet TAKE 1 TABLET(1000 MG) BY MOUTH EVERY DAY 90 tablet 3  . vitamin B-12 (CYANOCOBALAMIN) 250 MCG tablet Take 250 mcg by mouth daily.     No current facility-administered medications for this visit.   Lamictal is for anxiety.   Family History  Problem Relation Age of Onset  . Breast cancer Mother 68       age 86 and 87 and 74   . Lung cancer Maternal Grandmother   . Thyroid disease Maternal Grandmother   . Endometriosis Sister     Review of Systems  Constitutional: Negative.   HENT: Negative.   Eyes: Negative.   Respiratory: Negative.   Cardiovascular: Negative.   Gastrointestinal: Negative.   Endocrine: Negative.   Genitourinary: Negative.   Musculoskeletal: Negative.   Skin: Negative.   Allergic/Immunologic: Negative.   Neurological: Negative.   Psychiatric/Behavioral: Negative.     Exam:   BP 118/76 (BP Location: Right Arm, Patient Position: Sitting, Cuff Size: Normal)   Pulse 80   Resp 14   Ht 5' 8"  (1.727 m)   Wt 175 lb (79.4 kg)   LMP 01/03/2018   BMI 26.61 kg/m   Weight change: @WEIGHTCHANGE @ Height:   Height: 5' 8"  (172.7 cm)  Ht Readings from Last 3 Encounters:  02/16/18 5' 8"  (1.727 m)  07/30/17 5' 8"  (1.727 m)  06/15/17 5' 8"  (1.727 m)    General appearance: alert, cooperative and appears  stated age Head: Normocephalic, without obvious abnormality, atraumatic Neck: no adenopathy, supple, symmetrical, trachea midline and thyroid normal to inspection and palpation Lungs: clear to auscultation bilaterally Cardiovascular: regular rate and rhythm Breasts: normal appearance, no masses or tenderness Abdomen: soft, non-tender; non distended,  no masses,  no organomegaly Extremities: extremities normal, atraumatic, no cyanosis or edema Skin: Skin color, texture, turgor normal. No rashes or lesions Lymph nodes: Cervical, supraclavicular, and axillary nodes normal. No abnormal inguinal nodes palpated Neurologic: Grossly normal   Pelvic: External genitalia:  no lesions              Urethra:  normal appearing urethra with no masses, tenderness or lesions              Bartholins and Skenes: normal                 Vagina: normal appearing vagina with normal color and discharge, no lesions              Cervix: no lesions and IUD string 3 cm               Bimanual Exam:  Uterus:  normal size, contour, position, consistency, mobility, non-tender              Adnexa: no mass, fullness, tenderness               Rectovaginal: Confirms               Anus:  normal sphincter tone, no lesions  Chaperone was present for exam.  A:  Well Woman with normal exam  Elevated risk of breast cancer  IUD check  H/O LEEP  Vit d def  P:   Pap with hpv  Mammogram in June  Get a copy of Genetics recommendations about MRI's  Screening labs, vit d  Start Gardasil series  Discussed breast self exam  Discussed calcium and vit D intake   Genetic Records received.  Mother was BRCA negative. Patient with TC risk of 38% for breast cancer. The only remark on the note was MRI. I will confer with a Dietitian, but will likely recommend yearly mammograms and MRI's.

## 2018-02-17 LAB — COMPREHENSIVE METABOLIC PANEL
ALT: 10 IU/L (ref 0–32)
AST: 16 IU/L (ref 0–40)
Albumin/Globulin Ratio: 1.8 (ref 1.2–2.2)
Albumin: 4.6 g/dL (ref 3.5–5.5)
Alkaline Phosphatase: 54 IU/L (ref 39–117)
BILIRUBIN TOTAL: 0.2 mg/dL (ref 0.0–1.2)
BUN/Creatinine Ratio: 9 (ref 9–23)
BUN: 7 mg/dL (ref 6–20)
CALCIUM: 9.2 mg/dL (ref 8.7–10.2)
CHLORIDE: 100 mmol/L (ref 96–106)
CO2: 24 mmol/L (ref 20–29)
CREATININE: 0.75 mg/dL (ref 0.57–1.00)
GFR calc Af Amer: 123 mL/min/{1.73_m2} (ref >59)
GFR, EST NON AFRICAN AMERICAN: 107 mL/min/{1.73_m2} (ref >59)
GLUCOSE: 79 mg/dL (ref 65–99)
Globulin, Total: 2.5 g/dL (ref 1.5–4.5)
Potassium: 4.9 mmol/L (ref 3.5–5.2)
Sodium: 140 mmol/L (ref 134–144)
TOTAL PROTEIN: 7.1 g/dL (ref 6.0–8.5)

## 2018-02-17 LAB — LIPID PANEL
Chol/HDL Ratio: 3.3 ratio (ref 0.0–4.4)
Cholesterol, Total: 183 mg/dL (ref 100–199)
HDL: 55 mg/dL (ref >39)
LDL CALC: 104 mg/dL — AB (ref 0–99)
Triglycerides: 120 mg/dL (ref 0–149)
VLDL CHOLESTEROL CAL: 24 mg/dL (ref 5–40)

## 2018-02-17 LAB — CBC
HEMATOCRIT: 43 % (ref 34.0–46.6)
HEMOGLOBIN: 14.5 g/dL (ref 11.1–15.9)
MCH: 32.2 pg (ref 26.6–33.0)
MCHC: 33.7 g/dL (ref 31.5–35.7)
MCV: 95 fL (ref 79–97)
Platelets: 280 10*3/uL (ref 150–379)
RBC: 4.51 x10E6/uL (ref 3.77–5.28)
RDW: 13.8 % (ref 12.3–15.4)
WBC: 5.3 10*3/uL (ref 3.4–10.8)

## 2018-02-17 LAB — CYTOLOGY - PAP
Diagnosis: NEGATIVE
HPV: NOT DETECTED

## 2018-02-17 LAB — VITAMIN D 25 HYDROXY (VIT D DEFICIENCY, FRACTURES): Vit D, 25-Hydroxy: 35.8 ng/mL (ref 30.0–100.0)

## 2018-02-22 ENCOUNTER — Other Ambulatory Visit: Payer: Self-pay | Admitting: Obstetrics and Gynecology

## 2018-02-22 NOTE — Telephone Encounter (Signed)
Medication refill request: Valtrex   Last AEX:  02-16-18  Next AEX: 03-08-19  Last MMG (if hormonal medication request): breast MRI neg- 03-25-17 Refill authorized: please advise

## 2018-02-22 NOTE — Telephone Encounter (Signed)
Valtrex script sent to the pharmacy with refills for a year.

## 2018-04-06 ENCOUNTER — Encounter: Payer: Self-pay | Admitting: Obstetrics and Gynecology

## 2018-04-11 ENCOUNTER — Other Ambulatory Visit: Payer: Self-pay | Admitting: Obstetrics and Gynecology

## 2018-04-11 ENCOUNTER — Telehealth: Payer: Self-pay | Admitting: *Deleted

## 2018-04-11 DIAGNOSIS — Z1231 Encounter for screening mammogram for malignant neoplasm of breast: Secondary | ICD-10-CM

## 2018-04-11 NOTE — Telephone Encounter (Signed)
MRI order signed

## 2018-04-11 NOTE — Telephone Encounter (Signed)
My Chart message from patient:  ----- Message from Mychart, Generic sent at 04/06/2018 11:04 AM EDT -----    Hello! [facility] where [facility] mammogram and MRI done last year, and they seemed confused about getting me scheduled. The woman said it looked like I was supposed to be going every 6 months for alternating MRI and mammogram, or that I was supposed to get additional screening done... Oops! I don't remember them saying that! They ask that you put in a request for the imaging. Is that something you could do? [facility] but I recall my insurance saying that was a preferred location.     Thanks so much! Have a great 4th!  -Gray[employer] ElectricMarissa Bush

## 2018-04-11 NOTE — Telephone Encounter (Signed)
Sp[facility] with Huntley DecSara at [facility]. Scheduled for screening MMG on 04/29/18 arriving at 2pm for 2:20pm appt. Was advised patient will need to complete ROI for records prior to imaging.   Spoke with patient, advised of appt as seen above [facility]. Provided instructions with how to complete ROI. Advised patient once order placed for breast MRI, our office will precert, GI will call you directly to schedule. Patient verbalizes understanding.   MRI order pended.

## 2018-04-11 NOTE — Telephone Encounter (Signed)
Per review of Epic, screening MMG and bilateral breast MRI [facility] See results scanned in Epic.   Call returned to patient. Patient request to schedule screening [facility] and [facility]. Advised I will call to schedule screening MMG, will review with Dr. Oscar LaJertson for breast MRI.  Patient verbalizes understanding and is agreeable.   Dr. Oscar LaJertson -ok to proceed with bilateral breast MRI?

## 2018-04-11 NOTE — Telephone Encounter (Signed)
See phone encounter.

## 2018-04-11 NOTE — Telephone Encounter (Signed)
Encounter closed

## 2018-04-18 ENCOUNTER — Ambulatory Visit (INDEPENDENT_AMBULATORY_CARE_PROVIDER_SITE_OTHER): Payer: BLUE CROSS/BLUE SHIELD

## 2018-04-18 VITALS — BP 110/64 | HR 74 | Resp 15 | Ht 68.0 in | Wt 179.0 lb

## 2018-04-18 DIAGNOSIS — Z23 Encounter for immunization: Secondary | ICD-10-CM

## 2018-04-18 NOTE — Progress Notes (Signed)
Patient in today for 2nd Gardasil injection.   Contraception: IUD LMP: IUD  Last AEX: 02/16/18 with Dr. Oscar LaJertson  Injection given in Left Deltoid. Patient tolerated shot well.   Patient informed next injection due in about 4 months. August 19, 2018  Advised patient, if not on birth control, to return for next injection with cycle.   Routed to provider for final review.  Encounter closed.

## 2018-04-29 ENCOUNTER — Ambulatory Visit
Admission: RE | Admit: 2018-04-29 | Discharge: 2018-04-29 | Disposition: A | Discharge status: Home / Self Care | Payer: BLUE CROSS/BLUE SHIELD | Source: Ambulatory Visit | Attending: Obstetrics and Gynecology | Admitting: Obstetrics and Gynecology

## 2018-04-29 DIAGNOSIS — Z1231 Encounter for screening mammogram for malignant neoplasm of breast: Secondary | ICD-10-CM

## 2018-05-03 ENCOUNTER — Other Ambulatory Visit: Payer: Self-pay | Admitting: Obstetrics and Gynecology

## 2018-05-03 ENCOUNTER — Ambulatory Visit
Admission: RE | Admit: 2018-05-03 | Discharge: 2018-05-03 | Disposition: A | Discharge status: Home / Self Care | Payer: BLUE CROSS/BLUE SHIELD | Source: Ambulatory Visit | Attending: Obstetrics and Gynecology | Admitting: Obstetrics and Gynecology

## 2018-05-03 DIAGNOSIS — Z1231 Encounter for screening mammogram for malignant neoplasm of breast: Secondary | ICD-10-CM

## 2018-05-03 MED ORDER — GADOBENATE DIMEGLUMINE 529 MG/ML IV SOLN
17.0000 mL | Freq: Once | INTRAVENOUS | Status: AC | PRN
  Administered 2018-05-03: 17 mL via INTRAVENOUS

## 2018-05-05 ENCOUNTER — Ambulatory Visit
Admission: RE | Admit: 2018-05-05 | Discharge: 2018-05-05 | Disposition: A | Discharge status: Home / Self Care | Payer: BLUE CROSS/BLUE SHIELD | Source: Ambulatory Visit | Attending: Obstetrics and Gynecology | Admitting: Obstetrics and Gynecology

## 2018-05-05 DIAGNOSIS — Z1231 Encounter for screening mammogram for malignant neoplasm of breast: Secondary | ICD-10-CM

## 2018-05-05 MED ORDER — GADOBENATE DIMEGLUMINE 529 MG/ML IV SOLN
16.0000 mL | Freq: Once | INTRAVENOUS | Status: AC | PRN
  Administered 2018-05-05: 16 mL via INTRAVENOUS

## 2018-06-14 IMAGING — MG 2D DIGITAL SCREENING BILATERAL MAMMOGRAM WITH CAD AND ADJUNCT TO
9 of 12 series · 9 of 28 positions shown · non-contrast
Comparison: None.

CLINICAL DATA: Screening. Baseline screening mammogram. Family
history of breast cancer (mother at age 37).

EXAM:
2D DIGITAL SCREENING BILATERAL MAMMOGRAM WITH CAD AND ADJUNCT TOMO

[R CC synth-2D]
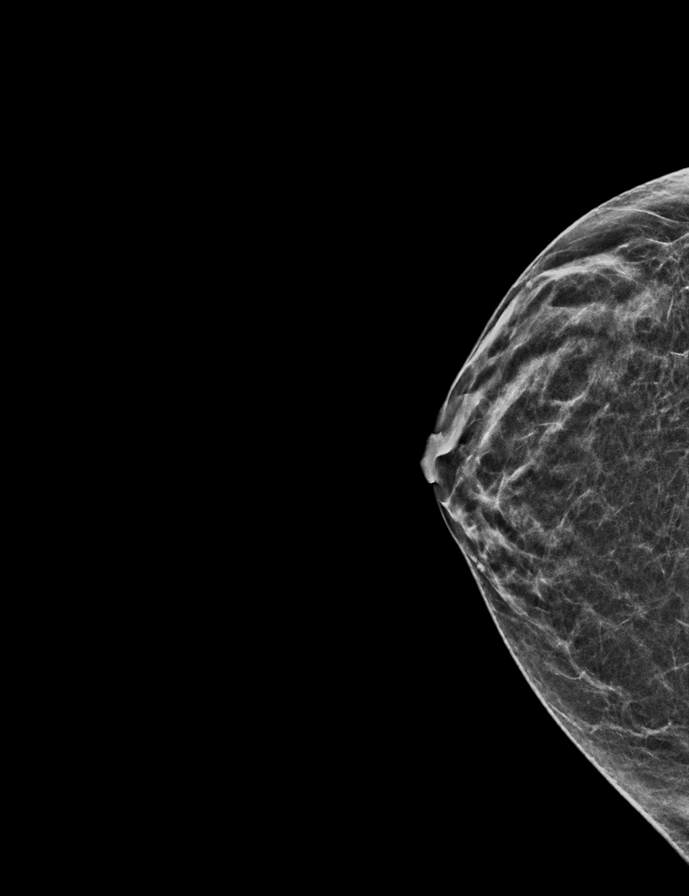

[R CC]
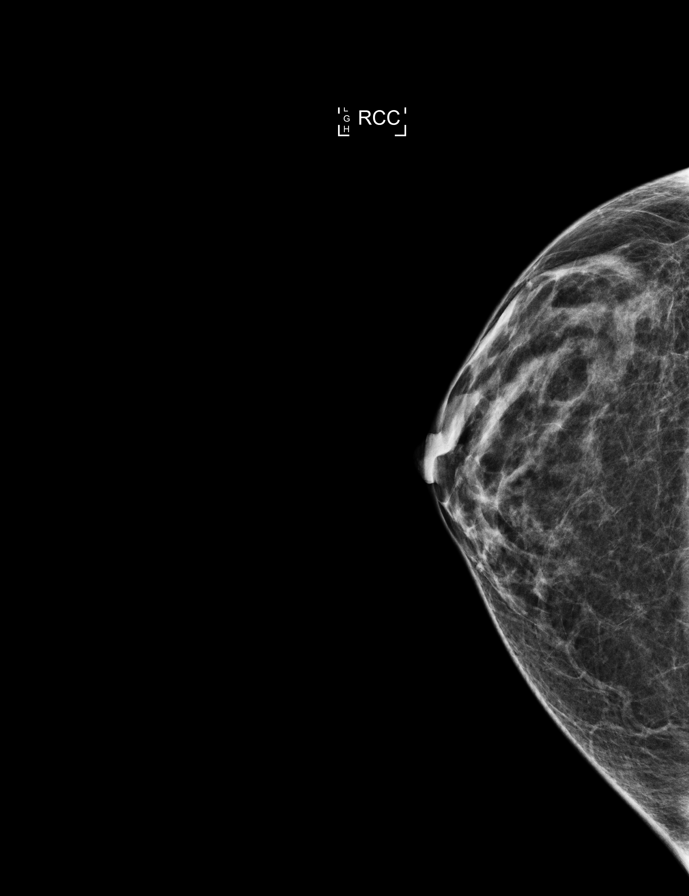

[L MLO synth-2D]
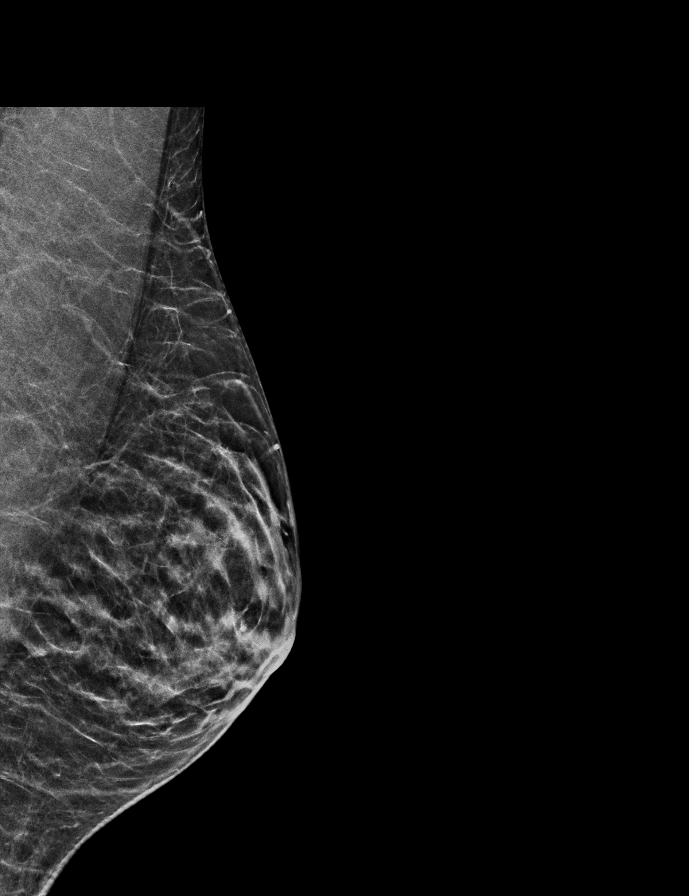

[R MLO]
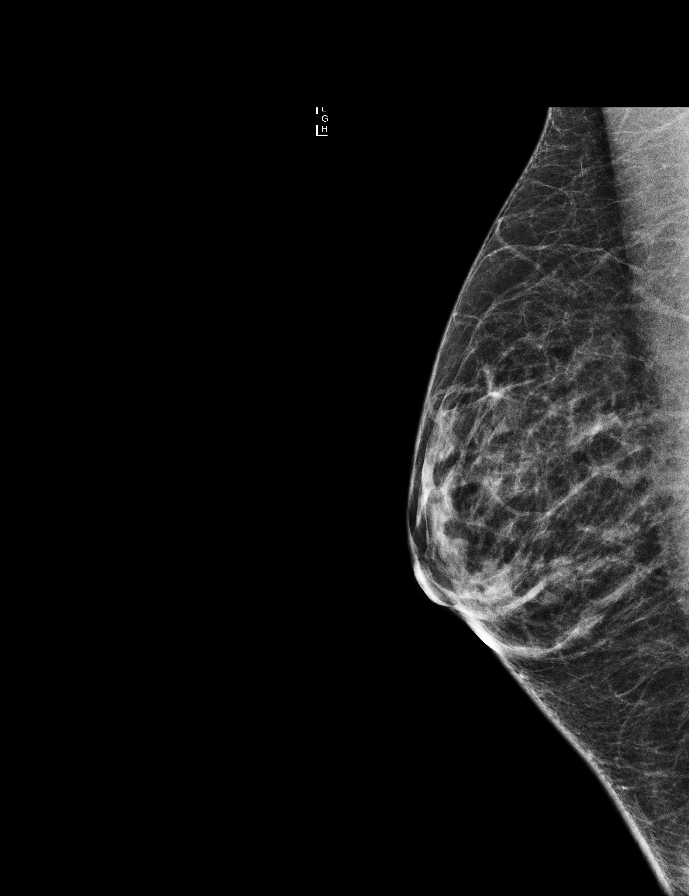

[L CC synth-2D]
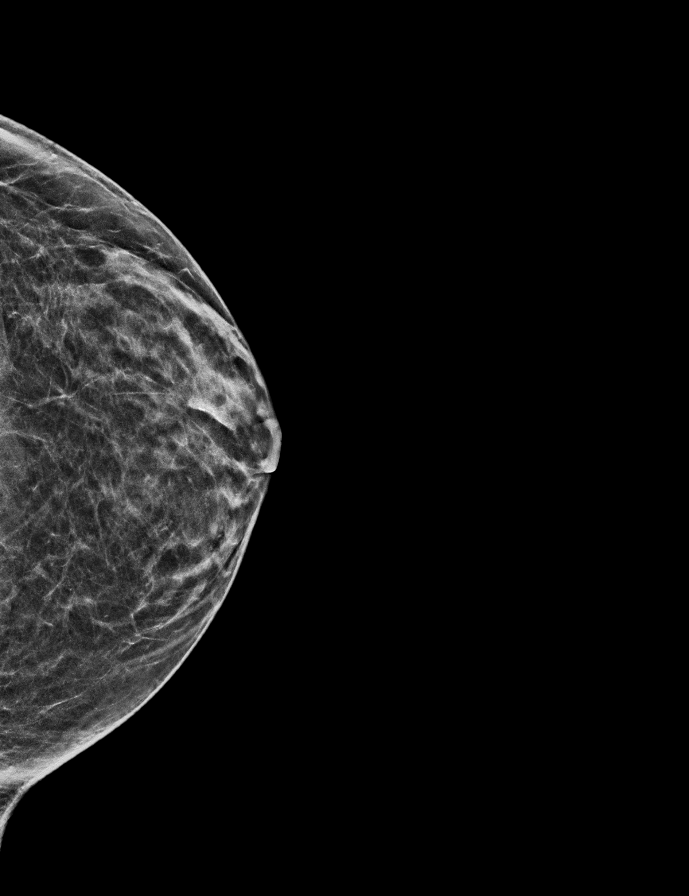

[L CC]
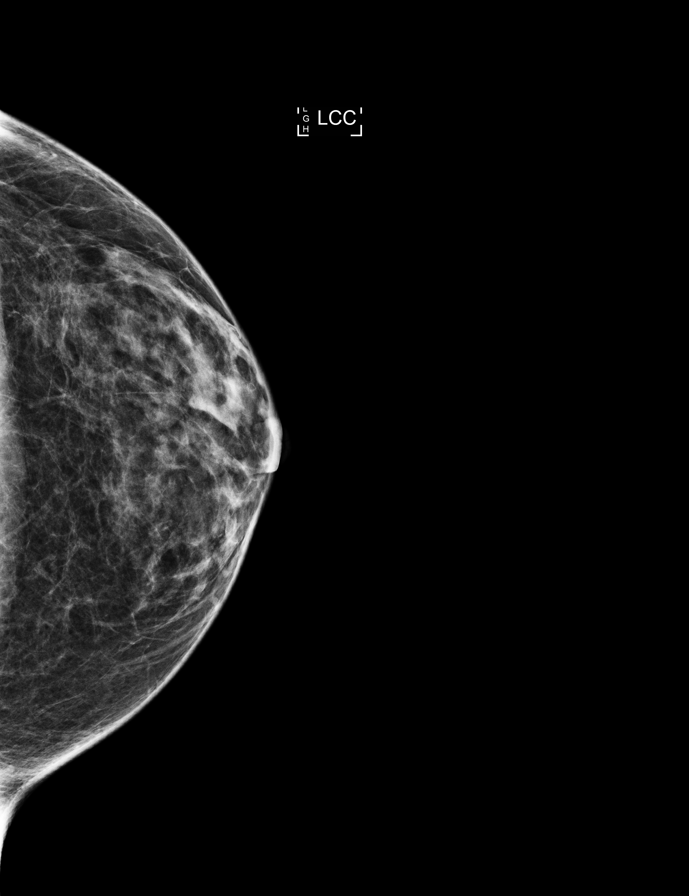

[R MLO synth-2D]
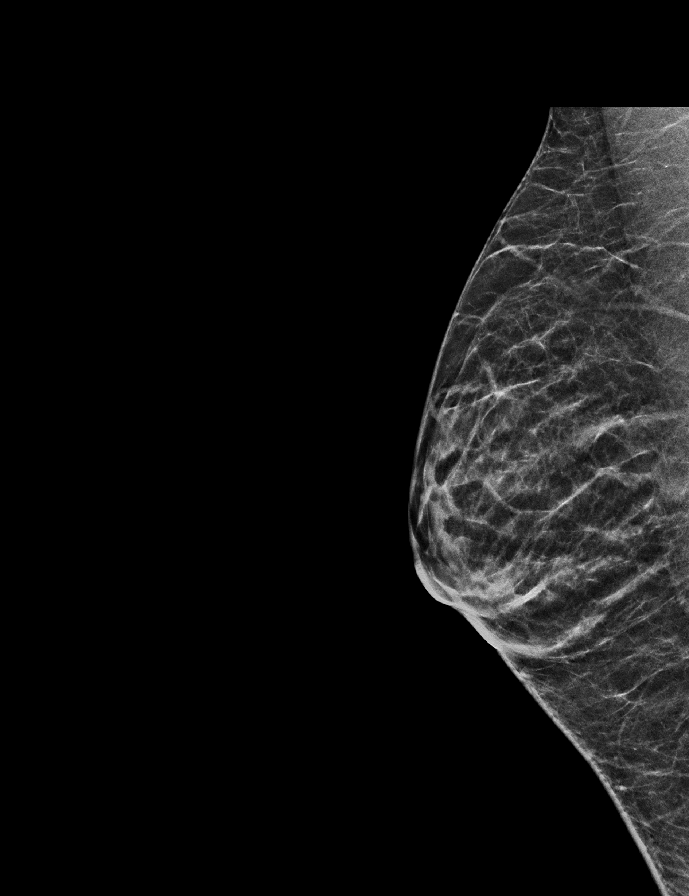

[L MLO]
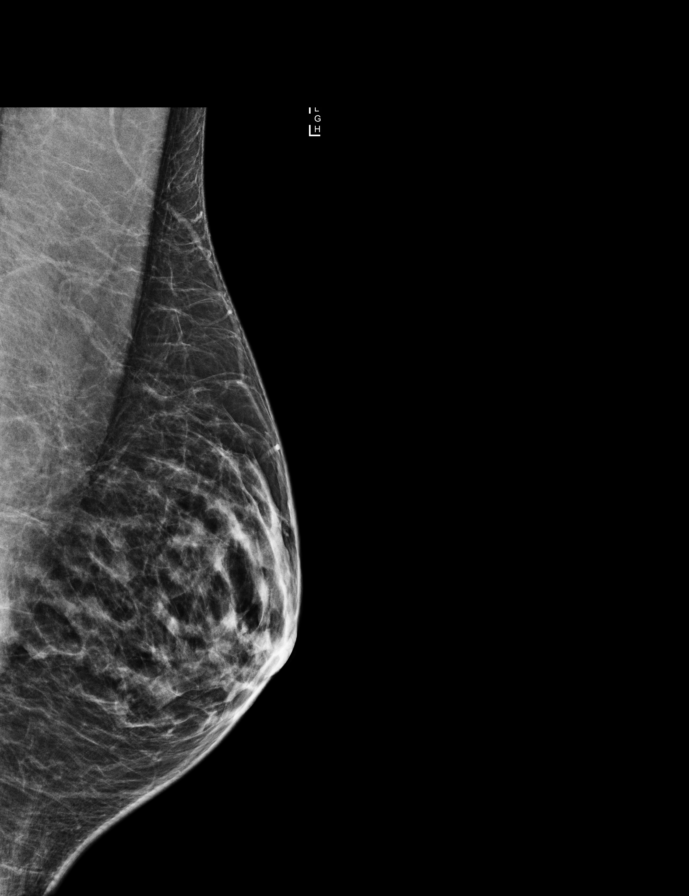

[R CC tomo · tomo slice 21/42.0]
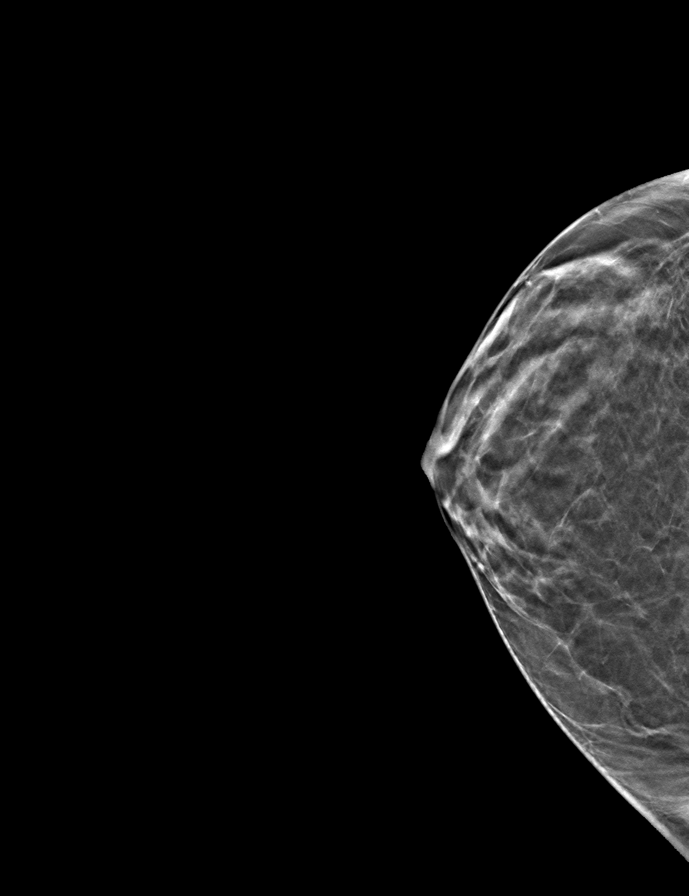

[9 of 28 positions shown; findings below may reference images not displayed]

ACR Breast Density Category b: There are scattered areas of
fibroglandular density.
FINDINGS: There are no findings suspicious for malignancy within either
breast. Images were processed with CAD.
IMPRESSION: No mammographic evidence of malignancy. A result letter of this
screening mammogram will be mailed directly to the patient.

RECOMMENDATION:
Screening mammogram at age 40. (Code:IL-R-NND) although patient's
family history may warrant initiating annual screening mammography
at this time.

Given patient's family history of breast cancer, would consider the
addition of annual screening breast MRI to annual screening
mammography at this time. Per American Cancer Society guidelines,
annual screening MRI of the breasts is recommended if a risk
assessment calculation for breast cancer, preferably using the
Tyrer-Cuzick model, measures greater than 20%.

BI-RADS CATEGORY  1: Negative.

## 2018-07-19 NOTE — Progress Notes (Deleted)
Patient in today for 3rd Gardasil injection.   Contraception: IUD LMP: IUD Last AEX: 02/16/18 with JJ  Injection given in right deltoid. Patient tolerated shot well.   Patient informed that she is finished with the series.   Advised patient, if not on birth control, to return for next injection with cycle.   Routed to provider for final review.  Encounter closed.

## 2018-07-21 ENCOUNTER — Ambulatory Visit: Payer: BLUE CROSS/BLUE SHIELD

## 2018-08-15 NOTE — Progress Notes (Signed)
Patient in today for 3rd Gardasil injection.   Contraception: IUD LMP: IUD Last AEX:02/16/18 with JJ  Injection given in Right Deltoid. Patient tolerated shot well.   Patient informed she is finished with the series.  Advised patient, if not on birth control, to return for next injection with cycle.   Routed to provider for final review.  Encounter closed.

## 2018-08-23 ENCOUNTER — Ambulatory Visit (INDEPENDENT_AMBULATORY_CARE_PROVIDER_SITE_OTHER): Payer: BLUE CROSS/BLUE SHIELD

## 2018-08-23 VITALS — BP 110/62 | HR 68 | Resp 14 | Ht 68.0 in | Wt 116.0 lb

## 2018-08-23 DIAGNOSIS — Z23 Encounter for immunization: Secondary | ICD-10-CM

## 2019-02-28 ENCOUNTER — Other Ambulatory Visit: Payer: Self-pay | Admitting: Obstetrics and Gynecology

## 2019-02-28 NOTE — Telephone Encounter (Signed)
Medication refill request: Valacyclovir 1000 mg take 1 tablet daily Last AEX:  02/16/2018 Next AEX: 03/08/2019 Last MMG (if hormonal medication request):N/A Refill authorized: Valacyclovir 1000 mg daily #30 0RF until next aex

## 2019-03-08 ENCOUNTER — Ambulatory Visit: Payer: BLUE CROSS/BLUE SHIELD | Admitting: Obstetrics and Gynecology

## 2019-04-02 ENCOUNTER — Other Ambulatory Visit: Payer: Self-pay | Admitting: Obstetrics and Gynecology

## 2019-04-03 NOTE — Telephone Encounter (Signed)
Medication refill request: Valtrex Last AEX:  02-16-18 JJ  Next AEX: detailed message left for patient to call and scheduled  Last MMG (if hormonal medication request): n/a Refill authorized: today, please advise.   Message left for patient to return call to schedule aex. Medication pended for #30, 0RF. Please refill if appropriate.

## 2019-04-26 ENCOUNTER — Other Ambulatory Visit: Payer: Self-pay | Admitting: Obstetrics and Gynecology

## 2019-04-26 NOTE — Telephone Encounter (Signed)
Medication refill request: Valtrex  Last AEX: 02/20/18 Next AEX: Called left message to schedule AEX  Last MMG (if hormonal medication request):  Refill authorized: #30 with 1 RF

## 2019-05-10 ENCOUNTER — Other Ambulatory Visit: Payer: Self-pay | Admitting: Obstetrics and Gynecology

## 2019-05-10 NOTE — Telephone Encounter (Signed)
Medication refill request: valtrex 1000mg  Last AEX:  02-16-18 Next AEX: not scheduled Last MMG (if hormonal medication request): n/a Refill authorized: pharmacy note placed for patient to call to schedule yearly exam.  Request 90 day supply. Please approve if appropriate
